# Patient Record
Sex: Female | Born: 1950 | Race: Black or African American | Hispanic: No | Marital: Married | Smoking: Former smoker
Health system: Southern US, Community
[De-identification: ages and names within clinical notes are randomized; demographics above are authoritative.]

## PROBLEM LIST (undated history)

## (undated) DIAGNOSIS — I10 Essential (primary) hypertension: Secondary | ICD-10-CM

## (undated) DIAGNOSIS — F329 Major depressive disorder, single episode, unspecified: Secondary | ICD-10-CM

## (undated) DIAGNOSIS — F32A Depression, unspecified: Secondary | ICD-10-CM

## (undated) DIAGNOSIS — E785 Hyperlipidemia, unspecified: Secondary | ICD-10-CM

## (undated) DIAGNOSIS — T7840XA Allergy, unspecified, initial encounter: Secondary | ICD-10-CM

## (undated) DIAGNOSIS — M1990 Unspecified osteoarthritis, unspecified site: Secondary | ICD-10-CM

## (undated) DIAGNOSIS — E119 Type 2 diabetes mellitus without complications: Secondary | ICD-10-CM

## (undated) DIAGNOSIS — G709 Myoneural disorder, unspecified: Secondary | ICD-10-CM

## (undated) DIAGNOSIS — F419 Anxiety disorder, unspecified: Secondary | ICD-10-CM

## (undated) DIAGNOSIS — K219 Gastro-esophageal reflux disease without esophagitis: Secondary | ICD-10-CM

## (undated) HISTORY — PX: ABDOMINAL HYSTERECTOMY: SHX81

## (undated) HISTORY — DX: Unspecified osteoarthritis, unspecified site: M19.90

## (undated) HISTORY — DX: Anxiety disorder, unspecified: F41.9

## (undated) HISTORY — DX: Hyperlipidemia, unspecified: E78.5

## (undated) HISTORY — DX: Gastro-esophageal reflux disease without esophagitis: K21.9

## (undated) HISTORY — DX: Essential (primary) hypertension: I10

## (undated) HISTORY — DX: Major depressive disorder, single episode, unspecified: F32.9

## (undated) HISTORY — DX: Type 2 diabetes mellitus without complications: E11.9

## (undated) HISTORY — DX: Depression, unspecified: F32.A

## (undated) HISTORY — DX: Myoneural disorder, unspecified: G70.9

## (undated) HISTORY — DX: Allergy, unspecified, initial encounter: T78.40XA

---

## 2014-01-07 DIAGNOSIS — H2589 Other age-related cataract: Secondary | ICD-10-CM | POA: Diagnosis not present

## 2014-01-07 DIAGNOSIS — H1045 Other chronic allergic conjunctivitis: Secondary | ICD-10-CM | POA: Diagnosis not present

## 2014-04-08 DIAGNOSIS — M5126 Other intervertebral disc displacement, lumbar region: Secondary | ICD-10-CM | POA: Diagnosis not present

## 2014-04-08 DIAGNOSIS — M5137 Other intervertebral disc degeneration, lumbosacral region: Secondary | ICD-10-CM | POA: Diagnosis not present

## 2014-06-27 DIAGNOSIS — M65311 Trigger thumb, right thumb: Secondary | ICD-10-CM | POA: Diagnosis not present

## 2014-09-13 DIAGNOSIS — I10 Essential (primary) hypertension: Secondary | ICD-10-CM | POA: Diagnosis not present

## 2015-04-02 ENCOUNTER — Encounter: Payer: Self-pay | Admitting: Physician Assistant

## 2015-04-02 ENCOUNTER — Ambulatory Visit (INDEPENDENT_AMBULATORY_CARE_PROVIDER_SITE_OTHER): Payer: Medicare Other | Admitting: Physician Assistant

## 2015-04-02 VITALS — BP 115/68 | HR 78 | Temp 97.8°F | Ht 67.0 in | Wt 180.0 lb

## 2015-04-02 DIAGNOSIS — E088 Diabetes mellitus due to underlying condition with unspecified complications: Secondary | ICD-10-CM

## 2015-04-02 DIAGNOSIS — E559 Vitamin D deficiency, unspecified: Secondary | ICD-10-CM | POA: Diagnosis not present

## 2015-04-02 DIAGNOSIS — E1140 Type 2 diabetes mellitus with diabetic neuropathy, unspecified: Secondary | ICD-10-CM | POA: Diagnosis not present

## 2015-04-02 DIAGNOSIS — E1142 Type 2 diabetes mellitus with diabetic polyneuropathy: Secondary | ICD-10-CM | POA: Insufficient documentation

## 2015-04-02 DIAGNOSIS — I10 Essential (primary) hypertension: Secondary | ICD-10-CM | POA: Diagnosis not present

## 2015-04-02 DIAGNOSIS — J309 Allergic rhinitis, unspecified: Secondary | ICD-10-CM | POA: Diagnosis not present

## 2015-04-02 NOTE — Progress Notes (Signed)
   Subjective:    Patient ID: Tammy Hudson, female    DOB: Oct 19, 1950, 64 y.o.   MRN: [id number]  HPI 64 y/o female with comorbid DM type 2 with peripheral neuropathy, htn, insomnia and anxiety presents for establishment of care. She has recently moved from [facility].     Review of Systems  Constitutional: Negative.   HENT: Negative.   Eyes:       Last eye exam 1 year ago.   Respiratory: Positive for shortness of breath.   Cardiovascular: Negative.   Gastrointestinal: Negative.   Endocrine: Positive for polyuria.  Genitourinary:       Nocturia   Musculoskeletal: Negative.   Skin: Negative.   Allergic/Immunologic: Negative.   Neurological: Positive for numbness (peripheral neuropathy ).  Hematological: Negative.   Psychiatric/Behavioral: Positive for sleep disturbance. The patient is nervous/anxious.        Objective:   Physical Exam  Constitutional: She appears well-developed and well-nourished. No distress.  HENT:  Head: Normocephalic and atraumatic.  Cardiovascular: Normal rate, regular rhythm, normal heart sounds and intact distal pulses.  Exam reveals no gallop and no friction rub.   No murmur heard. Skin: She is not diaphoretic.  Nursing note and vitals reviewed.         Assessment & Plan:  1. 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 Essential hypertension, benign  - TSH; Future - Lipid panel; Future - Microalbumin, urine; Future - Vit D  25 hydroxy (rtn osteoporosis monitoring); Future - CMP14+EGFR; Future - Hepatic function panel; Future - POCT glycosylated hemoglobin (Hb A1C); Future - CBC with Differential/Platelet; Future  3. Type 2 diabetes, controlled, with  neuropathy   4. Essential hypertension   5. Type 2 diabetes mellitus with peripheral neuropathy   6. Allergic rhinitis, unspecified allergic rhinitis type   7. Vitamin D deficiency  - Vit D  25 hydroxy (rtn osteoporosis monitoring); Future   Continue all meds Labs pending Health Maintenance reviewed Diet and exercise encouraged RTO 3 months   Tiffany A. Benjamin Stain PA-C

## 2015-04-03 ENCOUNTER — Other Ambulatory Visit (INDEPENDENT_AMBULATORY_CARE_PROVIDER_SITE_OTHER): Payer: Medicare Other

## 2015-04-03 DIAGNOSIS — I10 Essential (primary) hypertension: Secondary | ICD-10-CM | POA: Diagnosis not present

## 2015-04-03 DIAGNOSIS — E559 Vitamin D deficiency, unspecified: Secondary | ICD-10-CM | POA: Diagnosis not present

## 2015-04-03 DIAGNOSIS — E088 Diabetes mellitus due to underlying condition with unspecified complications: Secondary | ICD-10-CM | POA: Diagnosis not present

## 2015-04-03 LAB — POCT GLYCOSYLATED HEMOGLOBIN (HGB A1C): Hemoglobin A1C: 6

## 2015-04-03 NOTE — Progress Notes (Signed)
Lab only 

## 2015-04-04 LAB — CMP14+EGFR
A/G RATIO: 1.5 (ref 1.1–2.5)
ALT: 13 IU/L (ref 0–32)
AST: 32 IU/L (ref 0–40)
Albumin: 4 g/dL (ref 3.6–4.8)
Alkaline Phosphatase: 86 IU/L (ref 39–117)
BUN/Creatinine Ratio: 14 (ref 11–26)
BUN: 12 mg/dL (ref 8–27)
Bilirubin Total: 0.3 mg/dL (ref 0.0–1.2)
CALCIUM: 9.4 mg/dL (ref 8.7–10.3)
CO2: 25 mmol/L (ref 18–29)
CREATININE: 0.87 mg/dL (ref 0.57–1.00)
Chloride: 106 mmol/L (ref 97–108)
GFR, EST AFRICAN AMERICAN: 81 mL/min/{1.73_m2} (ref >59)
GFR, EST NON AFRICAN AMERICAN: 71 mL/min/{1.73_m2} (ref >59)
GLOBULIN, TOTAL: 2.7 g/dL (ref 1.5–4.5)
Glucose: 101 mg/dL — ABNORMAL HIGH (ref 65–99)
POTASSIUM: 4.6 mmol/L (ref 3.5–5.2)
SODIUM: 146 mmol/L — AB (ref 134–144)
TOTAL PROTEIN: 6.7 g/dL (ref 6.0–8.5)

## 2015-04-04 LAB — CBC WITH DIFFERENTIAL/PLATELET
BASOS: 0 %
Basophils Absolute: 0 10*3/uL (ref 0.0–0.2)
EOS (ABSOLUTE): 0.3 10*3/uL (ref 0.0–0.4)
EOS: 5 %
HEMATOCRIT: 43.7 % (ref 34.0–46.6)
Hemoglobin: 14.5 g/dL (ref 11.1–15.9)
Immature Grans (Abs): 0 10*3/uL (ref 0.0–0.1)
Immature Granulocytes: 0 %
LYMPHS ABS: 2.4 10*3/uL (ref 0.7–3.1)
Lymphs: 43 %
MCH: 30.7 pg (ref 26.6–33.0)
MCHC: 33.2 g/dL (ref 31.5–35.7)
MCV: 92 fL (ref 79–97)
MONOS ABS: 0.7 10*3/uL (ref 0.1–0.9)
Monocytes: 12 %
Neutrophils Absolute: 2.2 10*3/uL (ref 1.4–7.0)
Neutrophils: 40 %
PLATELETS: 255 10*3/uL (ref 150–379)
RBC: 4.73 x10E6/uL (ref 3.77–5.28)
RDW: 14.3 % (ref 12.3–15.4)
WBC: 5.5 10*3/uL (ref 3.4–10.8)

## 2015-04-04 LAB — LIPID PANEL
CHOLESTEROL TOTAL: 261 mg/dL — AB (ref 100–199)
Chol/HDL Ratio: 6.2 ratio units — ABNORMAL HIGH (ref 0.0–4.4)
HDL: 42 mg/dL (ref >39)
LDL Calculated: 181 mg/dL — ABNORMAL HIGH (ref 0–99)
TRIGLYCERIDES: 189 mg/dL — AB (ref 0–149)
VLDL CHOLESTEROL CAL: 38 mg/dL (ref 5–40)

## 2015-04-04 LAB — VITAMIN D 25 HYDROXY (VIT D DEFICIENCY, FRACTURES): Vit D, 25-Hydroxy: 11.9 ng/mL — ABNORMAL LOW (ref 30.0–100.0)

## 2015-04-04 LAB — MICROALBUMIN, URINE: Microalbumin, Urine: 5.5 ug/mL

## 2015-04-04 LAB — TSH: TSH: 1.76 u[IU]/mL (ref 0.450–4.500)

## 2015-04-04 LAB — HEPATIC FUNCTION PANEL: Bilirubin, Direct: 0.08 mg/dL (ref 0.00–0.40)

## 2015-04-08 ENCOUNTER — Other Ambulatory Visit: Payer: Self-pay | Admitting: Physician Assistant

## 2015-04-08 DIAGNOSIS — G629 Polyneuropathy, unspecified: Secondary | ICD-10-CM

## 2015-04-08 DIAGNOSIS — F329 Major depressive disorder, single episode, unspecified: Secondary | ICD-10-CM

## 2015-04-08 DIAGNOSIS — F32A Depression, unspecified: Secondary | ICD-10-CM

## 2015-04-08 DIAGNOSIS — I10 Essential (primary) hypertension: Secondary | ICD-10-CM

## 2015-04-08 DIAGNOSIS — E119 Type 2 diabetes mellitus without complications: Secondary | ICD-10-CM

## 2015-04-08 MED ORDER — METFORMIN HCL 500 MG PO TABS: 500.0000 mg | ORAL_TABLET | Freq: Every day | ORAL | Status: DC

## 2015-04-08 MED ORDER — MIRTAZAPINE 15 MG PO TABS: 15.0000 mg | ORAL_TABLET | Freq: Every day | ORAL | Status: DC

## 2015-04-08 MED ORDER — GABAPENTIN 100 MG PO CAPS: 100.0000 mg | ORAL_CAPSULE | Freq: Three times a day (TID) | ORAL | Status: DC

## 2015-04-08 MED ORDER — AMLODIPINE BESYLATE 5 MG PO TABS: 5.0000 mg | ORAL_TABLET | Freq: Every day | ORAL | Status: DC

## 2015-04-08 MED ORDER — LORATADINE 10 MG PO TABS: 10.0000 mg | ORAL_TABLET | Freq: Every day | ORAL | Status: DC

## 2015-04-08 MED ORDER — PROPRANOLOL HCL 10 MG PO TABS: 10.0000 mg | ORAL_TABLET | Freq: Three times a day (TID) | ORAL | Status: DC | PRN

## 2015-04-15 LAB — HM MAMMOGRAPHY

## 2015-05-13 ENCOUNTER — Other Ambulatory Visit: Payer: Self-pay

## 2015-05-13 MED ORDER — LORATADINE 10 MG PO TABS: 10.0000 mg | ORAL_TABLET | Freq: Every day | ORAL | Status: DC

## 2015-07-07 DIAGNOSIS — M545 Low back pain: Secondary | ICD-10-CM | POA: Diagnosis not present

## 2015-09-08 DIAGNOSIS — M4806 Spinal stenosis, lumbar region: Secondary | ICD-10-CM | POA: Diagnosis not present

## 2015-09-16 DIAGNOSIS — M545 Low back pain: Secondary | ICD-10-CM | POA: Diagnosis not present

## 2015-09-16 DIAGNOSIS — M5137 Other intervertebral disc degeneration, lumbosacral region: Secondary | ICD-10-CM | POA: Diagnosis not present

## 2015-10-16 ENCOUNTER — Ambulatory Visit (INDEPENDENT_AMBULATORY_CARE_PROVIDER_SITE_OTHER): Payer: Medicare Other

## 2015-10-16 ENCOUNTER — Encounter: Payer: Self-pay | Admitting: Family Medicine

## 2015-10-16 ENCOUNTER — Telehealth: Payer: Self-pay | Admitting: Family Medicine

## 2015-10-16 ENCOUNTER — Ambulatory Visit (INDEPENDENT_AMBULATORY_CARE_PROVIDER_SITE_OTHER): Payer: Medicare Other | Admitting: Family Medicine

## 2015-10-16 VITALS — BP 133/79 | HR 76 | Temp 98.3°F | Ht 67.0 in | Wt 175.6 lb

## 2015-10-16 DIAGNOSIS — E1140 Type 2 diabetes mellitus with diabetic neuropathy, unspecified: Secondary | ICD-10-CM | POA: Diagnosis not present

## 2015-10-16 DIAGNOSIS — M5431 Sciatica, right side: Secondary | ICD-10-CM | POA: Diagnosis not present

## 2015-10-16 DIAGNOSIS — R1013 Epigastric pain: Secondary | ICD-10-CM

## 2015-10-16 MED ORDER — TRAMADOL HCL 50 MG PO TABS: 50.0000 mg | ORAL_TABLET | Freq: Four times a day (QID) | ORAL | Status: DC | PRN

## 2015-10-16 NOTE — Progress Notes (Signed)
   HPI  Patient presents today with abdominal pain.  Patient explains she has crampy epigastric abdominal pain which started about 3-4 days ago. She has associated nausea, no vomiting, and constipation. She's had 2-3 small bowel movements since this started. Symptoms all began about 2-3 days after eating pinto beans, she states that normally she is very regular.  She denies any vomiting He denies alcohol use She has type 2 diabetes, followed here and also at a clinic in [place], her last A1c was 6.2, she has good medication compliance  PMH: Smoking status noted ROS: Per HPI  Objective: BP 133/79 mmHg  Pulse 76  Temp(Src) 98.3 F (36.8 C) (Oral)  Ht 5' 7" (1.702 m)  Wt 175 lb 9.6 oz (79.652 kg)  BMI 27.50 kg/m2 Gen: NAD, alert, cooperative with exam HEENT: NCAT CV: RRR, good S1/S2, no murmur Resp: CTABL, no wheezes, non-labored Abd: SNTND, BS present, no guarding or organomegaly Ext: No edema, warm Neuro: Alert and oriented, No gross deficits  DG abd- prominent bowel gas, some concern for air fluid levels on the R, awaiting read by radiology  Assessment and plan:  # Abdominal pain Likely constipation related Confirmed with x-ray and rule out other etiology using labs Consider also obstruction, although very unlikely with normal bowel movements and no vomiting, worsening diabetes, pancreatitis Return to clinic with any concerns or failure to improve as expected Senna S 2 pills 3.-4 days No clinical obstruction   # Sciatica Status post recent epidural injection Filled tramadol      Orders Placed This Encounter  Procedures  . DG Abd 2 Views    Standing Status: Future     Number of Occurrences:      Standing Expiration Date: 12/15/2016    Order Specific Question:  Reason for Exam (SYMPTOM  OR DIAGNOSIS REQUIRED)    Answer:  abd pain, eval stool burden    Order Specific Question:  Preferred imaging location?    Answer:  Internal  . Lipase  .  CMP14+EGFR  . CBC with Differential    Meds ordered this encounter  Medications  . DISCONTD: traMADol (ULTRAM) 50 MG tablet    Sig:   . traMADol (ULTRAM) 50 MG tablet    Sig: Take 1 tablet (50 mg total) by mouth every 6 (six) hours as needed.    Dispense:  60 tablet    Refill:  0    Sam Bradshaw, MD [facility] 10/16/2015, 11:04 AM      

## 2015-10-16 NOTE — Telephone Encounter (Signed)
Pt returned call.   She understands recommendations. She will have sips of liquids today, full liquids tomorrow and try to takle amll meals on Sunday. If she is vomiting, has sevre pain, or cannot tolerate fluids she will seek help in the ED.   Discussed ileus and sual course  Laroy Apple, MD [facility] 10/16/2015, 1:03 PM

## 2015-10-16 NOTE — Telephone Encounter (Signed)
Attempted call, left message with her mother to call back  Possible developing ileus, will ask her not to take senna, plan bowel rest with sips of fluids for tonight. Can advance to liquids tomorrow. Ok to advance to foods Sunday if tolerating fluids well. Low threshold for ED if abd pain is severe or begins to vomit.   Would ask her to return on Monday for exam and repeat exam.

## 2015-10-16 NOTE — Patient Instructions (Signed)
Great to meet you!  It sounds like you are having constipation, i am doing extra studies to rule out additional causes of your pain.   Lets try sennakot S, 2 pills once daily for the next 3-4 days  We will call with labs within 1 week

## 2015-10-19 ENCOUNTER — Encounter (INDEPENDENT_AMBULATORY_CARE_PROVIDER_SITE_OTHER): Payer: Self-pay

## 2015-10-19 ENCOUNTER — Ambulatory Visit (INDEPENDENT_AMBULATORY_CARE_PROVIDER_SITE_OTHER): Payer: Medicare Other

## 2015-10-19 ENCOUNTER — Ambulatory Visit (INDEPENDENT_AMBULATORY_CARE_PROVIDER_SITE_OTHER): Payer: Medicare Other | Admitting: Family Medicine

## 2015-10-19 ENCOUNTER — Encounter: Payer: Self-pay | Admitting: Family Medicine

## 2015-10-19 VITALS — BP 144/88 | HR 73 | Temp 97.6°F | Ht 67.0 in | Wt 173.8 lb

## 2015-10-19 DIAGNOSIS — R109 Unspecified abdominal pain: Secondary | ICD-10-CM

## 2015-10-19 NOTE — Progress Notes (Signed)
   HPI  Patient presents today here for recheck of abdominal pain.  Patient explains that her nausea, abdominal pain, and symptoms have resolved. She's been tolerating fluids throughout the weekend without any problem. However she has not been eating, only drinking fluids. She has mild belching She is passing gas light normal. She denies any abdominal pain. No fever, chills, sweats.   PMH: Smoking status noted ROS: Per HPI  Objective: BP 144/88 mmHg  Pulse 73  Temp(Src) 97.6 F (36.4 C) (Oral)  Ht 5\' 7"  (1.702 m)  Wt 173 lb 12.8 oz (78.835 kg)  BMI 27.21 kg/m2 Gen: NAD, alert, cooperative with exam HEENT: NCAT CV: RRR, good S1/S2, no murmur Resp: CTABL, no wheezes, non-labored Abd: soft, mild tenderness to palpation in RLQ, no guarding, + BS Ext: No edema, warm Neuro: Alert and oriented, No gross deficits  Plain film of the abdomen: Normal bowel gas pattern  Assessment and plan:  # Nausea, abdominal pain Improving, advance diet Plain film of the abdomen shows that air-fluid levels are resolved Awaiting radiology read  Discussed advancing diet slowly Return to clinic with any concerns, consider blood work and/or more advanced imaging considering her right lower quadrant abdominal pain, however her exam is very reassuring and her pain is mild on palpation     Orders Placed This Encounter  Procedures  . DG Abd 2 Views    Standing Status: Future     Number of Occurrences: 1     Standing Expiration Date: 12/18/2016    Order Specific Question:  Reason for Exam (SYMPTOM  OR DIAGNOSIS REQUIRED)    Answer:  re-check, abd pain and nasea, concern for ileus last film    Order Specific Question:  Preferred imaging location?    Answer:  Internal    Laroy Apple, MD [facility] 10/19/2015, 2:50 PM

## 2015-10-19 NOTE — Patient Instructions (Signed)
Great to see you!  Try to start eating small meals, you may want to try full liquids a time or two before progressing all the way to normal. (think ensure or milkshakes)  Come back with any concerns

## 2015-10-27 DIAGNOSIS — M4806 Spinal stenosis, lumbar region: Secondary | ICD-10-CM | POA: Diagnosis not present

## 2015-11-09 DIAGNOSIS — M47816 Spondylosis without myelopathy or radiculopathy, lumbar region: Secondary | ICD-10-CM | POA: Diagnosis not present

## 2015-11-09 DIAGNOSIS — M545 Low back pain: Secondary | ICD-10-CM | POA: Diagnosis not present

## 2015-11-09 DIAGNOSIS — M4806 Spinal stenosis, lumbar region: Secondary | ICD-10-CM | POA: Diagnosis not present

## 2015-11-10 ENCOUNTER — Encounter: Payer: Self-pay | Admitting: *Deleted

## 2015-11-13 DIAGNOSIS — R05 Cough: Secondary | ICD-10-CM | POA: Diagnosis not present

## 2015-11-26 ENCOUNTER — Encounter (INDEPENDENT_AMBULATORY_CARE_PROVIDER_SITE_OTHER): Payer: Self-pay

## 2015-12-08 ENCOUNTER — Encounter: Payer: Self-pay | Admitting: Family Medicine

## 2015-12-08 ENCOUNTER — Ambulatory Visit (INDEPENDENT_AMBULATORY_CARE_PROVIDER_SITE_OTHER): Payer: Medicare Other | Admitting: Family Medicine

## 2015-12-08 VITALS — BP 132/81 | HR 65 | Temp 97.6°F | Ht 67.0 in | Wt 171.2 lb

## 2015-12-08 DIAGNOSIS — E1142 Type 2 diabetes mellitus with diabetic polyneuropathy: Secondary | ICD-10-CM

## 2015-12-08 DIAGNOSIS — J309 Allergic rhinitis, unspecified: Secondary | ICD-10-CM | POA: Diagnosis not present

## 2015-12-08 MED ORDER — FEXOFENADINE HCL 180 MG PO TABS: 180.0000 mg | ORAL_TABLET | Freq: Every day | ORAL | Status: DC

## 2015-12-08 NOTE — Patient Instructions (Addendum)
Great to see you!  Change loratidine to allegra , continue flonase  Its ok to stop metformin but you will have to watch your diet closer.  Diet Recommendations for Diabetes   Starchy (carb) foods include: Bread, rice, pasta, potatoes, corn, crackers, bagels, muffins, all baked goods.   Protein foods include: Meat, fish, poultry, eggs, dairy foods, and beans such as pinto and kidney beans (beans also provide carbohydrate).   1. Eat at least 3 meals and 1-2 snacks per day. Never go more than 4-5 hours while awake without eating.  2. Limit starchy foods to TWO per meal and ONE per snack. ONE portion of a starchy  food is equal to the following:   - ONE slice of bread (or its equivalent, such as half of a hamburger bun).   - 1/2 cup of a "scoopable" starchy food such as potatoes or rice.   - 1 OUNCE (28 grams) of starchy snack foods such as crackers or pretzels (look on label).   - 15 grams of carbohydrate as shown on food label.  3. Both lunch and dinner should include a protein food, a carb food, and vegetables.   - Obtain twice as many veg's as protein or carbohydrate foods for both lunch and dinner.   - Try to keep frozen veg's on hand for a quick vegetable serving.     - Fresh or frozen veg's are best.  4. Breakfast should always include protein.

## 2015-12-08 NOTE — Progress Notes (Signed)
   HPI  Patient presents today here with worsening allergies.  Patient's lines over the last week she's had worsening runny nose and bilateral watering eyes with irritation. She has no fever, chills, sweats, cough, or shortness of breath. She has had frequent throat clearing.  She would really like to stop metformin, her brothers currently in the hospital with lactic acidosis after complications involving metformin, presumably after he has had renal issues. We discussed this at length and I explained that metformin does not cause renal failure however it can cause lactic acidosis after renal failure or decreased renal function. Watching her diet closely as pertains to diabetes.  Like to change primary care to [place].   PMH: Smoking status noted ROS: Per HPI  Objective: BP 132/81 mmHg  Pulse 65  Temp(Src) 97.6 F (36.4 C) (Oral)  Ht 5\' 7"  (1.702 m)  Wt 171 lb 3.2 oz (77.656 kg)  BMI 26.81 kg/m2 Gen: NAD, alert, cooperative with exam HEENT: NCAT, nares with swelling of the turbinates bilaterally, oropharynx clear CV: RRR, good S1/S2, no murmur Resp: CTABL, no wheezes, non-labored Abd: SNTND, BS present, no guarding or organomegaly Ext: No edema, warm Neuro: Alert and oriented, No gross deficits  Assessment and plan:  # Seasonal allergies, allergic rhinitis Continue Flonase, change Claritin to [employer] today, she would like to take Allegra first and see if that makes a difference. Would gladly send Singulair if she does not have good improvement after a couple of weeks.   # Type 2 diabetes Last A1c well controlled She has good diet compliance Would like to stop metformin For now stop metformin, no additional medications, if diabetes control slips to above A1c of 7 will plan to start Tradjenta or other DPP4    Meds ordered this encounter  Medications  . fexofenadine (ALLEGRA) 180 MG tablet    Sig: Take 1 tablet (180 mg total) by mouth daily.   Dispense:  90 tablet    Refill:  [place], MD [facility] 12/08/2015, 11:36 AM

## 2016-01-04 ENCOUNTER — Other Ambulatory Visit: Payer: Self-pay | Admitting: Family Medicine

## 2016-01-05 MED ORDER — SIMVASTATIN 10 MG PO TABS: 10.0000 mg | ORAL_TABLET | Freq: Every day | ORAL | Status: DC

## 2016-01-05 NOTE — Telephone Encounter (Signed)
I have sent some simvastatin, I do not think she was on it (unless it was from another office). But according to her labs it is safe and indicated.   I have kept dose low with amlodipine on board, limit 20 mg  Recommend repeat labs in 2-3 months  Laroy Apple, MD [facility] 01/05/2016, 9:06 AM

## 2016-01-05 NOTE — Telephone Encounter (Signed)
Patient informed. 

## 2016-01-05 NOTE — Telephone Encounter (Signed)
Patient requesting refill of simvastatin to [facility], last lipid panel September 2016 and I do not see this medication on her med list or listed in [facility] notes

## 2016-01-29 ENCOUNTER — Telehealth: Payer: Self-pay | Admitting: Family Medicine

## 2016-01-29 DIAGNOSIS — E1169 Type 2 diabetes mellitus with other specified complication: Secondary | ICD-10-CM | POA: Insufficient documentation

## 2016-01-29 DIAGNOSIS — E1142 Type 2 diabetes mellitus with diabetic polyneuropathy: Secondary | ICD-10-CM

## 2016-01-29 DIAGNOSIS — E559 Vitamin D deficiency, unspecified: Secondary | ICD-10-CM | POA: Insufficient documentation

## 2016-01-29 DIAGNOSIS — E785 Hyperlipidemia, unspecified: Secondary | ICD-10-CM

## 2016-01-29 NOTE — Telephone Encounter (Signed)
Patient aware that orders for labs have been placed in chart

## 2016-01-29 NOTE — Telephone Encounter (Signed)
Patient has orders in for labs

## 2016-01-29 NOTE — Telephone Encounter (Signed)
Orders placed, Pt will need to be seen to dicuss results in 1-2 weeks.   Laroy Apple, MD [facility] 01/29/2016, 8:53 AM

## 2016-02-08 ENCOUNTER — Other Ambulatory Visit: Payer: Self-pay

## 2016-02-08 ENCOUNTER — Other Ambulatory Visit: Payer: Medicare Other

## 2016-02-08 DIAGNOSIS — E1142 Type 2 diabetes mellitus with diabetic polyneuropathy: Secondary | ICD-10-CM

## 2016-02-08 DIAGNOSIS — E785 Hyperlipidemia, unspecified: Secondary | ICD-10-CM | POA: Diagnosis not present

## 2016-02-08 DIAGNOSIS — E1140 Type 2 diabetes mellitus with diabetic neuropathy, unspecified: Secondary | ICD-10-CM

## 2016-02-08 LAB — BAYER DCA HB A1C WAIVED: HB A1C (BAYER DCA - WAIVED): 6.1 % (ref <7.0)

## 2016-02-09 LAB — CMP14+EGFR
A/G RATIO: 1.6 (ref 1.2–2.2)
ALBUMIN: 4.2 g/dL (ref 3.6–4.8)
ALT: 17 IU/L (ref 0–32)
AST: 37 IU/L (ref 0–40)
Alkaline Phosphatase: 73 IU/L (ref 39–117)
BUN / CREAT RATIO: 17 (ref 12–28)
BUN: 15 mg/dL (ref 8–27)
Bilirubin Total: 0.5 mg/dL (ref 0.0–1.2)
CALCIUM: 9.4 mg/dL (ref 8.7–10.3)
CO2: 26 mmol/L (ref 18–29)
Chloride: 101 mmol/L (ref 96–106)
Creatinine, Ser: 0.87 mg/dL (ref 0.57–1.00)
GFR, EST AFRICAN AMERICAN: 81 mL/min/{1.73_m2} (ref >59)
GFR, EST NON AFRICAN AMERICAN: 71 mL/min/{1.73_m2} (ref >59)
GLOBULIN, TOTAL: 2.7 g/dL (ref 1.5–4.5)
Glucose: 99 mg/dL (ref 65–99)
POTASSIUM: 4.6 mmol/L (ref 3.5–5.2)
SODIUM: 141 mmol/L (ref 134–144)
TOTAL PROTEIN: 6.9 g/dL (ref 6.0–8.5)

## 2016-02-09 LAB — LIPID PANEL
CHOLESTEROL TOTAL: 179 mg/dL (ref 100–199)
Chol/HDL Ratio: 3.9 ratio units (ref 0.0–4.4)
HDL: 46 mg/dL (ref >39)
LDL Calculated: 92 mg/dL (ref 0–99)
TRIGLYCERIDES: 204 mg/dL — AB (ref 0–149)
VLDL Cholesterol Cal: 41 mg/dL — ABNORMAL HIGH (ref 5–40)

## 2016-02-09 LAB — CBC WITH DIFFERENTIAL/PLATELET
BASOS: 0 %
Basophils Absolute: 0 10*3/uL (ref 0.0–0.2)
EOS (ABSOLUTE): 0.2 10*3/uL (ref 0.0–0.4)
Eos: 3 %
Hematocrit: 43.4 % (ref 34.0–46.6)
Hemoglobin: 14.2 g/dL (ref 11.1–15.9)
IMMATURE GRANS (ABS): 0 10*3/uL (ref 0.0–0.1)
Immature Granulocytes: 0 %
LYMPHS ABS: 2.5 10*3/uL (ref 0.7–3.1)
LYMPHS: 43 %
MCH: 30.5 pg (ref 26.6–33.0)
MCHC: 32.7 g/dL (ref 31.5–35.7)
MCV: 93 fL (ref 79–97)
Monocytes Absolute: 0.4 10*3/uL (ref 0.1–0.9)
Monocytes: 7 %
NEUTROS ABS: 2.7 10*3/uL (ref 1.4–7.0)
Neutrophils: 47 %
PLATELETS: 241 10*3/uL (ref 150–379)
RBC: 4.66 x10E6/uL (ref 3.77–5.28)
RDW: 14.4 % (ref 12.3–15.4)
WBC: 5.9 10*3/uL (ref 3.4–10.8)

## 2016-02-15 ENCOUNTER — Ambulatory Visit: Payer: Medicare Other | Admitting: Family Medicine

## 2016-02-15 ENCOUNTER — Ambulatory Visit (INDEPENDENT_AMBULATORY_CARE_PROVIDER_SITE_OTHER): Payer: Medicare Other | Admitting: Family Medicine

## 2016-02-15 ENCOUNTER — Encounter: Payer: Self-pay | Admitting: Family Medicine

## 2016-02-15 VITALS — BP 132/81 | HR 74 | Temp 98.3°F | Ht 67.0 in | Wt 170.0 lb

## 2016-02-15 DIAGNOSIS — M5442 Lumbago with sciatica, left side: Secondary | ICD-10-CM | POA: Insufficient documentation

## 2016-02-15 DIAGNOSIS — I10 Essential (primary) hypertension: Secondary | ICD-10-CM

## 2016-02-15 DIAGNOSIS — E1142 Type 2 diabetes mellitus with diabetic polyneuropathy: Secondary | ICD-10-CM | POA: Diagnosis not present

## 2016-02-15 MED ORDER — FEXOFENADINE HCL 180 MG PO TABS
180.0000 mg | ORAL_TABLET | Freq: Every day | ORAL | 3 refills | Status: DC
Start: 2016-02-15 — End: 2016-11-16

## 2016-02-15 MED ORDER — GABAPENTIN 300 MG PO CAPS: 300.0000 mg | ORAL_CAPSULE | Freq: Three times a day (TID) | ORAL | 5 refills | Status: DC

## 2016-02-15 MED ORDER — PROPRANOLOL HCL 10 MG PO TABS: 10.0000 mg | ORAL_TABLET | Freq: Three times a day (TID) | ORAL | 5 refills | Status: DC | PRN

## 2016-02-15 MED ORDER — TRAMADOL HCL 50 MG PO TABS: 50.0000 mg | ORAL_TABLET | Freq: Four times a day (QID) | ORAL | 2 refills | Status: DC | PRN

## 2016-02-15 NOTE — Patient Instructions (Signed)
Great to see you!  I have increased your gabapentin (this can also help with anxiety) Increase to 300 mg capsule at night for 1 week, then 1 capsule at night and in the morning for 1 week, then 1 capsule 3 times a day.   I have also refilled her tramadol.  Please come back in 4 months.

## 2016-02-15 NOTE — Progress Notes (Signed)
   HPI  Patient presents today here for diabetes follow-up and lab review.  Diabetes Good medication compliance We did not review her CBGs. Watches her diet.  Left-sided low back pain with sciatica Has intermittent problems, she's having increased problems recently She has had improvement with gabapentin, however she's never titrated the dose Tramadol also helping, request increase in dose.  Hypertension No chest pain, dyspnea, palpitations, leg edema. Good medication compliance   PMH: Smoking status noted ROS: Per HPI  Objective: BP 132/81   Pulse 74   Temp 98.3 F (36.8 C) (Oral)   Ht 5\' 7"  (1.702 m)   Wt 170 lb (77.1 kg)   BMI 26.63 kg/m  Gen: NAD, alert, cooperative with exam HEENT: NCAT CV: RRR, good S1/S2, no murmur Resp: CTABL, no wheezes, non-labored Ext: No edema, warm Neuro: Alert and oriented, No gross deficits  Assessment and plan:  # Left-sided low back pain with sciatica Stable pain, requesting increase in tramadol. Titrate gabapentin, discussed keeping the tramadol does stable.   # Essential hypertension Well-controlled on amlodipine  # Type 2 diabetes Well-controlled on only metformin. Her kidney function is very good.    Meds ordered this encounter  Medications  . gabapentin (NEURONTIN) 300 MG capsule    Sig: Take 1 capsule (300 mg total) by mouth 3 (three) times daily.    Dispense:  90 capsule    Refill:  5  . propranolol (INDERAL) 10 MG tablet    Sig: Take 1 tablet (10 mg total) by mouth 3 (three) times daily as needed.    Dispense:  90 tablet    Refill:  5  . traMADol (ULTRAM) 50 MG tablet    Sig: Take 1 tablet (50 mg total) by mouth every 6 (six) hours as needed.    Dispense:  60 tablet    Refill:  [place], MD [facility] 02/15/2016, 5:25 PM

## 2016-05-06 ENCOUNTER — Encounter: Payer: Self-pay | Admitting: Family Medicine

## 2016-05-06 ENCOUNTER — Ambulatory Visit (INDEPENDENT_AMBULATORY_CARE_PROVIDER_SITE_OTHER): Payer: Medicare Other | Admitting: Family Medicine

## 2016-05-06 VITALS — BP 138/82 | HR 73 | Temp 98.1°F | Ht 67.0 in | Wt 167.0 lb

## 2016-05-06 DIAGNOSIS — I10 Essential (primary) hypertension: Secondary | ICD-10-CM

## 2016-05-06 DIAGNOSIS — E1142 Type 2 diabetes mellitus with diabetic polyneuropathy: Secondary | ICD-10-CM

## 2016-05-06 DIAGNOSIS — Z634 Disappearance and death of family member: Secondary | ICD-10-CM

## 2016-05-06 MED ORDER — ALPRAZOLAM 0.25 MG PO TABS: 0.2500 mg | ORAL_TABLET | Freq: Two times a day (BID) | ORAL | 0 refills | Status: DC | PRN

## 2016-05-06 NOTE — Patient Instructions (Addendum)
   I am so sorry to hear about your sister.   Lets follow up in 3 months unless you need [place] sooner

## 2016-05-06 NOTE — Progress Notes (Signed)
   HPI  Patient presents today here for follow-up chronic medical conditions and bereavement.  Patient is found to her sister hasterminal cancer 2 days ago. She requests something to help her nerves. She is very sad and very upsetthat she will likely lose her sister.  Type 2 diabetes She watches her diet, does not check her blood sugars as it has been well controlled for several years. Tolerates metformin easily. No foot numbness.  Hypertension Good medication compliance,  no chest pain, dyspnea, palpitations, leg edema.   She is not taking tramadol any longer  PMH: Smoking status noted ROS: Per HPI  Objective: BP 138/82   Pulse 73   Temp 98.1 F (36.7 C) (Oral)   Ht 5\' 7"  (1.702 m)   Wt 167 lb (75.8 kg)   BMI 26.16 kg/m  Gen: NAD, alert, cooperative with exam, tearful HEENT: NCAT CV: RRR, good S1/S2, no murmur Resp: CTABL, no wheezes, non-labored Ext: No edema, warm Neuro: Alert and oriented, No gross deficits  Diabetic Foot Exam - Simple   Simple Foot Form Visual Inspection No deformities, no ulcerations, no other skin breakdown bilaterally:  Yes Sensation Testing Intact to touch and monofilament testing bilaterally:  Yes Pulse Check Posterior Tibialis and Dorsalis pulse intact bilaterally:  Yes Comments      Assessment and plan:  # bereavement Understandable sadness, I've given her low dose xanax to help with anxiety RTC as needed   # T2DM Well controlled previously given current loss I have deferred labs A1C in 3 months COntinue metformin  # HTN Well controlled on amlodipine Consider lisinopril   Pt will request [facility] records  Opthalmology referral  Meds ordered this encounter  Medications  . ALPRAZolam (XANAX) 0.25 MG tablet    Sig: Take 1 tablet (0.25 mg total) by mouth 2 (two) times daily as needed for anxiety.    Dispense:  30 tablet    Refill:  0    Laroy Apple, MD [facility] 05/06/2016, 3:44  PM

## 2016-05-27 ENCOUNTER — Other Ambulatory Visit: Payer: Self-pay | Admitting: Family Medicine

## 2016-05-27 MED ORDER — ALPRAZOLAM 0.25 MG PO TABS: 0.2500 mg | ORAL_TABLET | Freq: Two times a day (BID) | ORAL | 0 refills | Status: DC | PRN

## 2016-05-27 NOTE — Telephone Encounter (Signed)
#  30 ordered on 05/06/16, she takes bid. Needs new Rx, route to pool for call in

## 2016-05-30 ENCOUNTER — Other Ambulatory Visit: Payer: Self-pay | Admitting: Family Medicine

## 2016-05-31 ENCOUNTER — Other Ambulatory Visit: Payer: Self-pay | Admitting: Family Medicine

## 2016-05-31 NOTE — Telephone Encounter (Signed)
Dr. Wendi Snipes, I contacted the pharmacy and the medication had never been phoned in, so I called [facility] and called in the refill.

## 2016-05-31 NOTE — Telephone Encounter (Signed)
I sent 60 pills 4 days ago, this request is too son for refill.   Laroy Apple, MD [facility] 05/31/2016, 9:36 AM

## 2016-06-01 DIAGNOSIS — E119 Type 2 diabetes mellitus without complications: Secondary | ICD-10-CM | POA: Diagnosis not present

## 2016-06-01 DIAGNOSIS — Z7984 Long term (current) use of oral hypoglycemic drugs: Secondary | ICD-10-CM | POA: Diagnosis not present

## 2016-06-01 DIAGNOSIS — H04123 Dry eye syndrome of bilateral lacrimal glands: Secondary | ICD-10-CM | POA: Diagnosis not present

## 2016-06-01 DIAGNOSIS — H43812 Vitreous degeneration, left eye: Secondary | ICD-10-CM | POA: Diagnosis not present

## 2016-06-02 ENCOUNTER — Ambulatory Visit (INDEPENDENT_AMBULATORY_CARE_PROVIDER_SITE_OTHER): Payer: Medicare Other | Admitting: Pediatrics

## 2016-06-02 ENCOUNTER — Encounter: Payer: Self-pay | Admitting: Pediatrics

## 2016-06-02 VITALS — BP 145/80 | HR 80 | Temp 97.4°F | Ht 67.0 in | Wt 169.4 lb

## 2016-06-02 DIAGNOSIS — I10 Essential (primary) hypertension: Secondary | ICD-10-CM | POA: Diagnosis not present

## 2016-06-02 DIAGNOSIS — T148XXA Other injury of unspecified body region, initial encounter: Secondary | ICD-10-CM

## 2016-06-02 DIAGNOSIS — E1142 Type 2 diabetes mellitus with diabetic polyneuropathy: Secondary | ICD-10-CM | POA: Diagnosis not present

## 2016-06-02 NOTE — Progress Notes (Signed)
  Subjective:   Patient ID: [name], female    DOB: [birth year], 65 y.o.   MRN: [id number] CC: Bruise  HPI: Tammy Hudson is a 65 y.o. female presenting for Bruise  Two days ago noticed dark areas like bruises x2 apprx 1 cm on her L lower leg Was red around two places yesterday, no redness today Does not remember hurting herself Feels "different", slightly tender yesterday, not tender today No swelling in legs No other bruises Has been feeling well otherwise No fevers No pain walking on leg No other rashes  Relevant past medical, surgical, family and social history reviewed. Allergies and medications reviewed and updated. History  Smoking Status  . Former Smoker  . Quit date: 04/02/1995  Smokeless Tobacco  . Never Used   ROS: Per HPI   Objective:    BP (!) 145/80   Pulse 80   Temp 97.4 F (36.3 C) (Oral)   Ht 5\' 7"  (1.702 m)   Wt 169 lb 6.4 oz (76.8 kg)   BMI 26.53 kg/m   Wt Readings from Last 3 Encounters:  06/02/16 169 lb 6.4 oz (76.8 kg)  05/06/16 167 lb (75.8 kg)  02/15/16 170 lb (77.1 kg)    Gen: NAD, alert, cooperative with exam, NCAT EYES: EOMI, no conjunctival injection, or no icterus CV: NRRR, normal S1/S2, no murmur Resp: CTABL, no wheezes, normal WOB Ext: No edema, warm Neuro: Alert and oriented Skin: L lower leg medial lower leg and lateral lower leg with 12-45mm dark purple patches on shin, some induration present around patches, slight yellowing around edges of purple patches.    Assessment & Plan:  Martesha was seen today for likely bruises. Pt does not remember injury, does have yellowing around areas. No other bruising. Let me know if redness returns, any other areas develop. Check CBC.  Diagnoses and all orders for this visit:  Bruising -     CBC with Differential/Platelet  Follow up plan: prn Assunta Found, MD [place]

## 2016-06-03 LAB — CBC WITH DIFFERENTIAL/PLATELET
BASOS: 0 %
Basophils Absolute: 0 10*3/uL (ref 0.0–0.2)
EOS (ABSOLUTE): 0.2 10*3/uL (ref 0.0–0.4)
Eos: 4 %
HEMOGLOBIN: 13.5 g/dL (ref 11.1–15.9)
Hematocrit: 41.3 % (ref 34.0–46.6)
IMMATURE GRANS (ABS): 0 10*3/uL (ref 0.0–0.1)
IMMATURE GRANULOCYTES: 0 %
LYMPHS: 53 %
Lymphocytes Absolute: 3 10*3/uL (ref 0.7–3.1)
MCH: 30.1 pg (ref 26.6–33.0)
MCHC: 32.7 g/dL (ref 31.5–35.7)
MCV: 92 fL (ref 79–97)
MONOCYTES: 10 %
Monocytes Absolute: 0.6 10*3/uL (ref 0.1–0.9)
NEUTROS ABS: 1.9 10*3/uL (ref 1.4–7.0)
Neutrophils: 33 %
Platelets: 235 10*3/uL (ref 150–379)
RBC: 4.49 x10E6/uL (ref 3.77–5.28)
RDW: 13.9 % (ref 12.3–15.4)
WBC: 5.6 10*3/uL (ref 3.4–10.8)

## 2016-06-07 ENCOUNTER — Ambulatory Visit (INDEPENDENT_AMBULATORY_CARE_PROVIDER_SITE_OTHER): Payer: Medicare Other

## 2016-06-07 DIAGNOSIS — Z23 Encounter for immunization: Secondary | ICD-10-CM

## 2016-06-28 ENCOUNTER — Other Ambulatory Visit: Payer: Self-pay | Admitting: Family Medicine

## 2016-06-28 DIAGNOSIS — Z1231 Encounter for screening mammogram for malignant neoplasm of breast: Secondary | ICD-10-CM

## 2016-07-07 ENCOUNTER — Ambulatory Visit (HOSPITAL_COMMUNITY)
Admission: RE | Admit: 2016-07-07 | Discharge: 2016-07-07 | Disposition: A | Discharge status: Home / Self Care | Payer: Medicare Other | Source: Ambulatory Visit | Attending: Family Medicine | Admitting: Family Medicine

## 2016-07-07 DIAGNOSIS — Z1231 Encounter for screening mammogram for malignant neoplasm of breast: Secondary | ICD-10-CM | POA: Insufficient documentation

## 2016-07-25 ENCOUNTER — Other Ambulatory Visit: Payer: Self-pay | Admitting: Family Medicine

## 2016-08-02 DIAGNOSIS — Z1231 Encounter for screening mammogram for malignant neoplasm of breast: Secondary | ICD-10-CM | POA: Diagnosis not present

## 2016-08-03 DIAGNOSIS — Z1231 Encounter for screening mammogram for malignant neoplasm of breast: Secondary | ICD-10-CM | POA: Diagnosis not present

## 2016-08-04 DIAGNOSIS — J329 Chronic sinusitis, unspecified: Secondary | ICD-10-CM | POA: Diagnosis not present

## 2016-08-04 DIAGNOSIS — R21 Rash and other nonspecific skin eruption: Secondary | ICD-10-CM | POA: Diagnosis not present

## 2016-08-04 DIAGNOSIS — Z0000 Encounter for general adult medical examination without abnormal findings: Secondary | ICD-10-CM | POA: Diagnosis not present

## 2016-08-04 DIAGNOSIS — E139 Other specified diabetes mellitus without complications: Secondary | ICD-10-CM | POA: Diagnosis not present

## 2016-08-04 DIAGNOSIS — I10 Essential (primary) hypertension: Secondary | ICD-10-CM | POA: Diagnosis not present

## 2016-08-04 DIAGNOSIS — E7800 Pure hypercholesterolemia, unspecified: Secondary | ICD-10-CM | POA: Diagnosis not present

## 2016-08-29 ENCOUNTER — Telehealth: Payer: Self-pay | Admitting: Family Medicine

## 2016-08-30 ENCOUNTER — Encounter: Payer: Self-pay | Admitting: Family Medicine

## 2016-08-30 ENCOUNTER — Ambulatory Visit (INDEPENDENT_AMBULATORY_CARE_PROVIDER_SITE_OTHER): Payer: Medicare Other | Admitting: Family Medicine

## 2016-08-30 VITALS — BP 131/76 | HR 99 | Temp 97.8°F | Ht 67.0 in | Wt 167.8 lb

## 2016-08-30 DIAGNOSIS — L089 Local infection of the skin and subcutaneous tissue, unspecified: Secondary | ICD-10-CM

## 2016-08-30 DIAGNOSIS — I10 Essential (primary) hypertension: Secondary | ICD-10-CM

## 2016-08-30 DIAGNOSIS — E1142 Type 2 diabetes mellitus with diabetic polyneuropathy: Secondary | ICD-10-CM | POA: Diagnosis not present

## 2016-08-30 LAB — BAYER DCA HB A1C WAIVED: HB A1C: 5.6 % (ref <7.0)

## 2016-08-30 MED ORDER — DOXYCYCLINE HYCLATE 100 MG PO TABS: 100.0000 mg | ORAL_TABLET | Freq: Two times a day (BID) | ORAL | 0 refills | Status: DC

## 2016-08-30 MED ORDER — AMOXICILLIN-POT CLAVULANATE 875-125 MG PO TABS: 1.0000 | ORAL_TABLET | Freq: Two times a day (BID) | ORAL | 0 refills | Status: DC

## 2016-08-30 NOTE — Patient Instructions (Signed)
Great to see you!  Take all antibiotics, follow up right away if the areas come back after you finish them.

## 2016-08-30 NOTE — Progress Notes (Signed)
   HPI  Patient presents today here to follow-up for recent skin infection.  Patient states that about 3 weeks ago she was seen in family practice in [place] while she was visiting her husband who has prostate cancer. She states she was diagnosed with a severe infection of her legs and treated with Augmentin plus doxycycline. She had complete resolution of symptoms which then returned about 5-6 days after she finished the course of antibiotics.  She states the current lesions have been there for 5-6 days currently.  She has occasional chills, however denies any fevers, sweats, or malaise. She's tolerating food and fluids normally.  Patient is taking Glucophage for diabetes, watching her diet as well  PMH: Smoking status noted ROS: Per HPI  Objective: BP 131/76   Pulse 99   Temp 97.8 F (36.6 C) (Oral)   Ht 5\' 7"  (1.702 m)   Wt 167 lb 12.8 oz (76.1 kg)   BMI 26.28 kg/m  Gen: NAD, alert, cooperative with exam HEENT: NCAT CV: RRR, good S1/S2, no murmur Resp: CTABL, no wheezes, non-labored Ext: No edema, warm Neuro: Alert and oriented, No gross deficits Skin:  # 5-10,  2 cm to 5 cm slightly subcutaneous nodules with the appearance of very faint bruises, slightly hyperpigmented. Distributed acorss BL LE No tenderness to palpation No areas of fluctuance or drainage. No warmth over the areas.  Assessment and plan:  # Skin infection Unclear etiology, however patient had very good improvement with Augmentin plus doxycycline I'm intrigued to find out what the other physician was interested in is the most likely etiology, we have requested specifically this office note. Patient appears very well without any apparent systemic symptoms. She has no tenderness or warmth over any of the areas. Treat with Augmentin plus doxycycline Return to clinic with any concern or very quickly if symptoms return after she finishes the course.  T2DM Well controled on  metformin Continue Recheck A1C in 3-4 months  HTN Well controlled on amlodipine- continue    Orders Placed This Encounter  Procedures  . Bayer DCA Hb A1c Waived    Meds ordered this encounter  Medications  . amoxicillin-clavulanate (AUGMENTIN) 875-125 MG tablet    Sig: Take 1 tablet by mouth 2 (two) times daily.    Dispense:  20 tablet    Refill:  0  . doxycycline (VIBRA-TABS) 100 MG tablet    Sig: Take 1 tablet (100 mg total) by mouth 2 (two) times daily. 1 po bid    Dispense:  20 tablet    Refill:  0    Laroy Apple, MD [facility] 08/30/2016, 4:14 PM

## 2016-09-09 ENCOUNTER — Other Ambulatory Visit: Payer: Self-pay | Admitting: Family Medicine

## 2016-09-09 NOTE — Telephone Encounter (Signed)
Refill on xanax called to voice mail.

## 2016-09-13 ENCOUNTER — Other Ambulatory Visit: Payer: Medicare Other

## 2016-10-05 DIAGNOSIS — R110 Nausea: Secondary | ICD-10-CM | POA: Diagnosis not present

## 2016-10-05 DIAGNOSIS — J069 Acute upper respiratory infection, unspecified: Secondary | ICD-10-CM | POA: Diagnosis not present

## 2016-11-16 ENCOUNTER — Emergency Department (HOSPITAL_COMMUNITY)
Admission: EM | Admit: 2016-11-16 | Discharge: 2016-11-16 | Disposition: A | Discharge status: Home / Self Care | Payer: Medicare Other | Attending: Emergency Medicine | Admitting: Emergency Medicine

## 2016-11-16 ENCOUNTER — Encounter (HOSPITAL_COMMUNITY): Payer: Self-pay | Admitting: Cardiology

## 2016-11-16 DIAGNOSIS — Z7984 Long term (current) use of oral hypoglycemic drugs: Secondary | ICD-10-CM | POA: Insufficient documentation

## 2016-11-16 DIAGNOSIS — E119 Type 2 diabetes mellitus without complications: Secondary | ICD-10-CM | POA: Insufficient documentation

## 2016-11-16 DIAGNOSIS — Z87891 Personal history of nicotine dependence: Secondary | ICD-10-CM | POA: Diagnosis not present

## 2016-11-16 DIAGNOSIS — I10 Essential (primary) hypertension: Secondary | ICD-10-CM | POA: Insufficient documentation

## 2016-11-16 DIAGNOSIS — L309 Dermatitis, unspecified: Secondary | ICD-10-CM | POA: Diagnosis not present

## 2016-11-16 DIAGNOSIS — Z79899 Other long term (current) drug therapy: Secondary | ICD-10-CM | POA: Diagnosis not present

## 2016-11-16 DIAGNOSIS — M7989 Other specified soft tissue disorders: Secondary | ICD-10-CM | POA: Diagnosis present

## 2016-11-16 LAB — COMPREHENSIVE METABOLIC PANEL
ALK PHOS: 58 U/L (ref 38–126)
ALT: 25 U/L (ref 14–54)
AST: 53 U/L — AB (ref 15–41)
Albumin: 3.4 g/dL — ABNORMAL LOW (ref 3.5–5.0)
Anion gap: 6 (ref 5–15)
BILIRUBIN TOTAL: 0.5 mg/dL (ref 0.3–1.2)
BUN: 17 mg/dL (ref 6–20)
CALCIUM: 8.6 mg/dL — AB (ref 8.9–10.3)
CO2: 25 mmol/L (ref 22–32)
CREATININE: 0.8 mg/dL (ref 0.44–1.00)
Chloride: 103 mmol/L (ref 101–111)
GFR calc Af Amer: >60 mL/min (ref >60)
GFR calc non Af Amer: >60 mL/min (ref >60)
GLUCOSE: 85 mg/dL (ref 65–99)
Potassium: 3.5 mmol/L (ref 3.5–5.1)
Sodium: 134 mmol/L — ABNORMAL LOW (ref 135–145)
TOTAL PROTEIN: 6.9 g/dL (ref 6.5–8.1)

## 2016-11-16 LAB — CBC WITH DIFFERENTIAL/PLATELET
BASOS ABS: 0 10*3/uL (ref 0.0–0.1)
BASOS PCT: 0 %
Eosinophils Absolute: 0.2 10*3/uL (ref 0.0–0.7)
Eosinophils Relative: 3 %
HEMATOCRIT: 40.5 % (ref 36.0–46.0)
HEMOGLOBIN: 13.6 g/dL (ref 12.0–15.0)
Lymphocytes Relative: 53 %
Lymphs Abs: 2.8 10*3/uL (ref 0.7–4.0)
MCH: 30.2 pg (ref 26.0–34.0)
MCHC: 33.6 g/dL (ref 30.0–36.0)
MCV: 89.8 fL (ref 78.0–100.0)
MONOS PCT: 11 %
Monocytes Absolute: 0.6 10*3/uL (ref 0.1–1.0)
NEUTROS ABS: 1.8 10*3/uL (ref 1.7–7.7)
NEUTROS PCT: 33 %
Platelets: 210 10*3/uL (ref 150–400)
RBC: 4.51 MIL/uL (ref 3.87–5.11)
RDW: 14.1 % (ref 11.5–15.5)
WBC: 5.3 10*3/uL (ref 4.0–10.5)

## 2016-11-16 MED ORDER — PREDNISONE 10 MG PO TABS: 20.0000 mg | ORAL_TABLET | Freq: Every day | ORAL | 0 refills | Status: DC

## 2016-11-16 NOTE — ED Provider Notes (Signed)
[facility] DEPT Provider Note   CSN: [id number] Arrival date & time: 11/16/16  1734 By signing my name below, I, Dyke Brackett, attest that this documentation has been prepared under the direction and in the presence of Milton Ferguson, MD . Electronically Signed: Dyke Brackett, Scribe. 11/16/2016. 8:40 PM.   History   Chief Complaint Chief Complaint  Patient presents with  . Leg Swelling    HPI Tammy Hudson is a 66 y.o. female with a history of DM, HLD and HTN who presents to the Emergency Department complaining of a moderate, gradually worsening rash to her bilateral legs since 10/17. She reports associated erythema and itching to the area. No alleviating or modifying factors noted. She denies any pain or weeping to the area. She denies the use of anti-coagulants. Pt has no other acute complaints or associated symptoms at this time.   The history is provided by the patient. No language interpreter was used.  Rash   This is a recurrent problem. The current episode started more than 1 week ago. The problem has not changed since onset.The problem is associated with nothing. There has been no fever. The pain is at a severity of 0/10. The patient is experiencing no pain. The pain has been constant since onset. Associated symptoms include blisters.    Past Medical History:  Diagnosis Date  . Allergy   . Anxiety   . Arthritis   . Depression   . Diabetes mellitus without complication ([place])   . GERD (gastroesophageal reflux disease)   . Hyperlipidemia   . Hypertension   . Neuromuscular disorder [facility])     Patient Active Problem List   Diagnosis Date Noted  . Left-sided low back pain with left-sided sciatica 02/15/2016  . HLD (hyperlipidemia) 01/29/2016  . Vitamin D deficiency 01/29/2016  . Essential hypertension 04/02/2015  . Type 2 diabetes mellitus with peripheral neuropathy ([place]) 04/02/2015  . Allergic rhinitis 04/02/2015    Past Surgical History:  Procedure Laterality  Date  . ABDOMINAL HYSTERECTOMY      OB History    No data available     Home Medications    Prior to Admission medications   Medication Sig Start Date End Date Taking? Authorizing Provider  ALPRAZolam Duanne Moron) 0.25 MG tablet TAKE ONE TABLET BY MOUTH TWICE DAILY AS NEEDED 09/09/16   Timmothy Euler, MD  amLODipine (NORVASC) 5 MG tablet Take 1 tablet (5 mg total) by mouth daily. 04/08/15   Tiffany A Gann, PA-C  amoxicillin-clavulanate (AUGMENTIN) 875-125 MG tablet Take 1 tablet by mouth 2 (two) times daily. 08/30/16   Timmothy Euler, MD  doxycycline (VIBRA-TABS) 100 MG tablet Take 1 tablet (100 mg total) by mouth 2 (two) times daily. 1 po bid 08/30/16   Timmothy Euler, MD  fexofenadine (ALLEGRA) 180 MG tablet Take 1 tablet (180 mg total) by mouth daily. 02/15/16   Timmothy Euler, MD  gabapentin (NEURONTIN) 300 MG capsule Take 1 capsule (300 mg total) by mouth 3 (three) times daily. 02/15/16   Timmothy Euler, MD  metFORMIN (GLUCOPHAGE) 500 MG tablet Take 1 tablet (500 mg total) by mouth daily. 04/08/15   Tiffany A Gann, PA-C  mirtazapine (REMERON) 15 MG tablet Take 1 tablet (15 mg total) by mouth at bedtime. Take 1 and one half tablets every night at bed time. 04/08/15   Tiffany A Gann, PA-C  naproxen (NAPROSYN) 500 MG tablet Take 500 mg by mouth 2 (two) times daily. 02/12/15   Historical Provider, MD  propranolol (INDERAL) 10 MG tablet Take 1 tablet (10 mg total) by mouth 3 (three) times daily as needed. 02/15/16   Timmothy Euler, MD  simvastatin (ZOCOR) 10 MG tablet Take 1 tablet (10 mg total) by mouth at bedtime. 01/05/16   Timmothy Euler, MD    Family History History reviewed. No pertinent family history.  Social History Social History  Substance Use Topics  . Smoking status: Former Smoker    Quit date: 04/02/1995  . Smokeless tobacco: Never Used  . Alcohol use No    Allergies   Patient has no known allergies.  Review of Systems Review of Systems  Constitutional:  Negative for appetite change and fatigue.  HENT: Negative for congestion, ear discharge and sinus pressure.   Eyes: Negative for discharge.  Respiratory: Negative for cough.   Cardiovascular: Negative for chest pain.  Gastrointestinal: Negative for abdominal pain and diarrhea.  Genitourinary: Negative for frequency and hematuria.  Musculoskeletal: Negative for back pain.  Skin: Positive for color change and rash.  Neurological: Negative for seizures and headaches.  Psychiatric/Behavioral: Negative for hallucinations.   Physical Exam Updated Vital Signs BP (!) 142/72   Pulse 82   Temp 98.5 F (36.9 C) (Oral)   Resp 16   Ht 5\' 7"  (1.702 m)   Wt 160 lb (72.6 kg)   SpO2 94%   BMI 25.06 kg/m   Physical Exam  Constitutional: She is oriented to person, place, and time. She appears well-developed.  HENT:  Head: Normocephalic.  Eyes: Conjunctivae and EOM are normal. No scleral icterus.  Neck: Neck supple. No thyromegaly present.  Cardiovascular: Normal rate and regular rhythm.  Exam reveals no gallop and no friction rub.   No murmur heard. Pulmonary/Chest: No stridor. She has no wheezes. She has no rales. She exhibits no tenderness.  Abdominal: She exhibits no distension. There is no tenderness. There is no rebound.  Musculoskeletal: Normal range of motion. She exhibits no edema.  Lymphadenopathy:    She has no cervical adenopathy.  Neurological: She is oriented to person, place, and time. She exhibits normal muscle tone. Coordination normal.  Skin: Rash noted. Rash is maculopapular. No erythema.  Maculopapular rash from groin distally bilaterally  Psychiatric: She has a normal mood and affect. Her behavior is normal.   ED Treatments / Results  DIAGNOSTIC STUDIES:  Oxygen Saturation is 96% on RA, normal by my interpretation.    COORDINATION OF CARE:  8:35 PM Discussed treatment plan with pt at bedside and pt agreed to plan.   Labs (all labs ordered are listed, but only  abnormal results are displayed) Labs Reviewed - No data to display  EKG  EKG Interpretation None       Radiology No results found.  Procedures Procedures (including critical care time)  Medications Ordered in ED Medications - No data to display   Initial Impression / Assessment and Plan / ED Course  I have reviewed the triage vital signs and the nursing notes.  Pertinent labs & imaging results that were available during my care of the patient were reviewed by me and considered in my medical decision making (see chart for details).    Dermatitis.  rx prednisone and follow up  Final Clinical Impressions(s) / ED Diagnoses   Final diagnoses:  None    New Prescriptions New Prescriptions   No medications on file   The chart was scribed for me under my direct supervision.  I personally performed the history, physical, and medical decision  making and all procedures in the evaluation of this patient.Milton Ferguson, MD 11/18/16 [phone]

## 2016-11-16 NOTE — ED Triage Notes (Signed)
Positive bilateral DP pulses

## 2016-11-16 NOTE — ED Triage Notes (Signed)
Bilateral leg swelling and rash times 4 months.  Worse now.

## 2016-11-16 NOTE — Discharge Instructions (Signed)
Follow-up with Dr. Nevada Crane the dermatologist in [place]

## 2016-11-16 NOTE — ED Notes (Signed)
Pt reports she has had a red and purple circular diffuse rash to bilateral legs appear in October, went away for two months and returned Monday. Denies pain, itching, weeping, or pain; denies blood thinner use. Endorses leg swelling at times.

## 2016-12-05 ENCOUNTER — Other Ambulatory Visit: Payer: Self-pay | Admitting: Family Medicine

## 2016-12-05 DIAGNOSIS — L958 Other vasculitis limited to the skin: Secondary | ICD-10-CM | POA: Diagnosis not present

## 2016-12-06 DIAGNOSIS — L958 Other vasculitis limited to the skin: Secondary | ICD-10-CM | POA: Diagnosis not present

## 2016-12-22 DIAGNOSIS — L958 Other vasculitis limited to the skin: Secondary | ICD-10-CM | POA: Diagnosis not present

## 2017-01-19 DIAGNOSIS — L958 Other vasculitis limited to the skin: Secondary | ICD-10-CM | POA: Diagnosis not present

## 2017-01-31 DIAGNOSIS — Z1211 Encounter for screening for malignant neoplasm of colon: Secondary | ICD-10-CM | POA: Diagnosis not present

## 2017-01-31 DIAGNOSIS — L959 Vasculitis limited to the skin, unspecified: Secondary | ICD-10-CM | POA: Diagnosis not present

## 2017-01-31 DIAGNOSIS — R5383 Other fatigue: Secondary | ICD-10-CM | POA: Diagnosis not present

## 2017-01-31 DIAGNOSIS — E1169 Type 2 diabetes mellitus with other specified complication: Secondary | ICD-10-CM | POA: Diagnosis not present

## 2017-01-31 DIAGNOSIS — F411 Generalized anxiety disorder: Secondary | ICD-10-CM | POA: Diagnosis not present

## 2017-01-31 DIAGNOSIS — I10 Essential (primary) hypertension: Secondary | ICD-10-CM | POA: Diagnosis not present

## 2017-01-31 DIAGNOSIS — M2550 Pain in unspecified joint: Secondary | ICD-10-CM | POA: Diagnosis not present

## 2017-01-31 DIAGNOSIS — E785 Hyperlipidemia, unspecified: Secondary | ICD-10-CM | POA: Diagnosis not present

## 2017-02-02 ENCOUNTER — Encounter (INDEPENDENT_AMBULATORY_CARE_PROVIDER_SITE_OTHER): Payer: Self-pay | Admitting: *Deleted

## 2017-02-14 ENCOUNTER — Other Ambulatory Visit (HOSPITAL_COMMUNITY): Payer: Self-pay | Admitting: Adult Health Nurse Practitioner

## 2017-02-14 DIAGNOSIS — Z0000 Encounter for general adult medical examination without abnormal findings: Secondary | ICD-10-CM | POA: Diagnosis not present

## 2017-02-14 DIAGNOSIS — Z780 Asymptomatic menopausal state: Secondary | ICD-10-CM | POA: Diagnosis not present

## 2017-02-14 DIAGNOSIS — Z23 Encounter for immunization: Secondary | ICD-10-CM | POA: Diagnosis not present

## 2017-02-16 ENCOUNTER — Encounter (INDEPENDENT_AMBULATORY_CARE_PROVIDER_SITE_OTHER): Payer: Self-pay

## 2017-02-16 ENCOUNTER — Encounter (INDEPENDENT_AMBULATORY_CARE_PROVIDER_SITE_OTHER): Payer: Self-pay | Admitting: Internal Medicine

## 2017-02-21 DIAGNOSIS — M2550 Pain in unspecified joint: Secondary | ICD-10-CM | POA: Diagnosis not present

## 2017-02-21 DIAGNOSIS — R768 Other specified abnormal immunological findings in serum: Secondary | ICD-10-CM | POA: Diagnosis not present

## 2017-02-21 DIAGNOSIS — M549 Dorsalgia, unspecified: Secondary | ICD-10-CM | POA: Diagnosis not present

## 2017-02-21 DIAGNOSIS — R21 Rash and other nonspecific skin eruption: Secondary | ICD-10-CM | POA: Diagnosis not present

## 2017-02-23 DIAGNOSIS — L958 Other vasculitis limited to the skin: Secondary | ICD-10-CM | POA: Diagnosis not present

## 2017-03-02 ENCOUNTER — Ambulatory Visit (INDEPENDENT_AMBULATORY_CARE_PROVIDER_SITE_OTHER): Payer: Medicare Other | Admitting: Internal Medicine

## 2017-03-02 ENCOUNTER — Telehealth (INDEPENDENT_AMBULATORY_CARE_PROVIDER_SITE_OTHER): Payer: Self-pay | Admitting: Internal Medicine

## 2017-03-02 ENCOUNTER — Encounter (INDEPENDENT_AMBULATORY_CARE_PROVIDER_SITE_OTHER): Payer: Self-pay | Admitting: Internal Medicine

## 2017-03-02 VITALS — BP 150/82 | HR 64 | Temp 97.5°F | Ht 67.0 in | Wt 159.8 lb

## 2017-03-02 DIAGNOSIS — R21 Rash and other nonspecific skin eruption: Secondary | ICD-10-CM

## 2017-03-02 NOTE — Telephone Encounter (Signed)
Please send copy of this note to Dr. Edrick Oh

## 2017-03-02 NOTE — Progress Notes (Addendum)
Subjective:    Patient ID: Tammy Hudson, female    DOB: 01-Sep-1950, 66 y.o.   MRN: [id number]  HPI Referred by Dr. Edrick Oh. She tells me she needs a colonoscopy. She has a rash. She has been on Prednisone for the rash . She says she has an infection in her body.    Her last colonoscopy was in May of 2014 and found to have diverticula which appeared to moderate severity. A single sessile 3 mm polyp of benign appearance in the sigmoid colon. Polyp was completely removed and retrieved.  [place], [place], [place].  Biopsy: Hyperplastic polyp.     She tells me she feel okay. Fine, red rash to her lower extremities. She says her feet are numb. She has tenderness in her rt hip. No change in her stool habits.  No abdominal pain.  She has a BM daily. No melena or BRRB.  Hx of diabetes x 1 yr.  01/31/2017 RA quaint 122.6  H and H 11.1 and 34.0, ANA direct positive, Anti-DNA (DS) less than one. sedrate 17 (normal) Review of Systems Past Medical History:  Diagnosis Date  . Allergy   . Anxiety   . Arthritis   . Depression   . Diabetes mellitus without complication ([place])   . GERD (gastroesophageal reflux disease)   . Hyperlipidemia   . Hypertension   . Neuromuscular disorder [facility])     Past Surgical History:  Procedure Laterality Date  . ABDOMINAL HYSTERECTOMY      No Known Allergies  Current Outpatient Prescriptions on File Prior to Visit  Medication Sig Dispense Refill  . ALPRAZolam (XANAX) 0.25 MG tablet TAKE 1 TABLET BY MOUTH TWICE DAILY AS NEEDED 60 tablet 2  . gabapentin (NEURONTIN) 300 MG capsule Take 1 capsule (300 mg total) by mouth 3 (three) times daily. (Patient taking differently: Take 300 mg by mouth daily as needed (for neuropathy pain). ) 90 capsule 5  . metFORMIN (GLUCOPHAGE) 500 MG tablet Take 1 tablet (500 mg total) by mouth daily. 30 tablet 2  . mirtazapine (REMERON) 15 MG tablet Take 1 tablet (15 mg total) by mouth at bedtime. Take 1 and one half  tablets every night at bed time. (Patient taking differently: Take 7.5-15 mg by mouth at bedtime. ) 45 tablet 2  . naproxen (NAPROSYN) 500 MG tablet Take 500 mg by mouth daily as needed for mild pain or moderate pain.     Marland Kitchen propranolol (INDERAL) 10 MG tablet Take 1 tablet (10 mg total) by mouth 3 (three) times daily as needed. (Patient taking differently: Take 10 mg by mouth daily. ) 90 tablet 5  . simvastatin (ZOCOR) 10 MG tablet Take 1 tablet (10 mg total) by mouth at bedtime. 90 tablet 3   No current facility-administered medications on file prior to visit.         Objective:   Physical Exam Blood pressure (!) 150/82, pulse 64, temperature (!) 97.5 F (36.4 C), height 5\' 7"  (1.702 m), weight 159 lb 12.8 oz (72.5 kg).  Alert and oriented. Skin warm and dry. Oral mucosa is moist.   . Sclera anicteric, conjunctivae is pink. Thyroid not enlarged. No cervical lymphadenopathy. Lungs clear. Heart regular rate and rhythm.  Abdomen is soft. Bowel sounds are positive. No hepatomegaly. No abdominal masses felt. No tenderness.  No edema to lower extremities.          Assessment & Plan:  Rash. She is presently being worked up by Dr. Edrick Oh  for this. Has been on Prednisone. States her RA lab came back positive. Will get labs from Dr. Edrick Oh. Will give patient a copy of her colonoscopy.

## 2017-03-02 NOTE — Telephone Encounter (Signed)
Noted faxed

## 2017-03-02 NOTE — Patient Instructions (Signed)
OV as needed 

## 2017-03-03 ENCOUNTER — Other Ambulatory Visit (HOSPITAL_COMMUNITY): Payer: Medicare Other

## 2017-03-07 DIAGNOSIS — R21 Rash and other nonspecific skin eruption: Secondary | ICD-10-CM | POA: Diagnosis not present

## 2017-03-07 DIAGNOSIS — M2550 Pain in unspecified joint: Secondary | ICD-10-CM | POA: Diagnosis not present

## 2017-03-07 DIAGNOSIS — M549 Dorsalgia, unspecified: Secondary | ICD-10-CM | POA: Diagnosis not present

## 2017-03-07 DIAGNOSIS — R768 Other specified abnormal immunological findings in serum: Secondary | ICD-10-CM | POA: Diagnosis not present

## 2017-03-15 ENCOUNTER — Ambulatory Visit (HOSPITAL_COMMUNITY)
Admission: RE | Admit: 2017-03-15 | Discharge: 2017-03-15 | Disposition: A | Discharge status: Home / Self Care | Payer: Medicare Other | Source: Ambulatory Visit | Attending: Adult Health Nurse Practitioner | Admitting: Adult Health Nurse Practitioner

## 2017-03-15 DIAGNOSIS — M8589 Other specified disorders of bone density and structure, multiple sites: Secondary | ICD-10-CM | POA: Diagnosis not present

## 2017-03-15 DIAGNOSIS — M8588 Other specified disorders of bone density and structure, other site: Secondary | ICD-10-CM | POA: Insufficient documentation

## 2017-03-15 DIAGNOSIS — Z780 Asymptomatic menopausal state: Secondary | ICD-10-CM

## 2017-03-23 DIAGNOSIS — L958 Other vasculitis limited to the skin: Secondary | ICD-10-CM | POA: Diagnosis not present

## 2017-03-23 DIAGNOSIS — M310 Hypersensitivity angiitis: Secondary | ICD-10-CM | POA: Diagnosis not present

## 2017-04-04 DIAGNOSIS — R768 Other specified abnormal immunological findings in serum: Secondary | ICD-10-CM | POA: Diagnosis not present

## 2017-04-04 DIAGNOSIS — M549 Dorsalgia, unspecified: Secondary | ICD-10-CM | POA: Diagnosis not present

## 2017-04-04 DIAGNOSIS — M2550 Pain in unspecified joint: Secondary | ICD-10-CM | POA: Diagnosis not present

## 2017-04-04 DIAGNOSIS — R21 Rash and other nonspecific skin eruption: Secondary | ICD-10-CM | POA: Diagnosis not present

## 2017-04-04 DIAGNOSIS — R079 Chest pain, unspecified: Secondary | ICD-10-CM | POA: Diagnosis not present

## 2017-04-25 DIAGNOSIS — M2550 Pain in unspecified joint: Secondary | ICD-10-CM | POA: Diagnosis not present

## 2017-04-25 DIAGNOSIS — R21 Rash and other nonspecific skin eruption: Secondary | ICD-10-CM | POA: Diagnosis not present

## 2017-04-25 DIAGNOSIS — I776 Arteritis, unspecified: Secondary | ICD-10-CM | POA: Diagnosis not present

## 2017-04-25 DIAGNOSIS — M059 Rheumatoid arthritis with rheumatoid factor, unspecified: Secondary | ICD-10-CM | POA: Diagnosis not present

## 2017-04-25 DIAGNOSIS — M25473 Effusion, unspecified ankle: Secondary | ICD-10-CM | POA: Diagnosis not present

## 2017-04-25 DIAGNOSIS — R768 Other specified abnormal immunological findings in serum: Secondary | ICD-10-CM | POA: Diagnosis not present

## 2017-04-25 DIAGNOSIS — Z79899 Other long term (current) drug therapy: Secondary | ICD-10-CM | POA: Diagnosis not present

## 2017-04-25 DIAGNOSIS — M549 Dorsalgia, unspecified: Secondary | ICD-10-CM | POA: Diagnosis not present

## 2017-05-02 ENCOUNTER — Other Ambulatory Visit: Payer: Self-pay | Admitting: Family Medicine

## 2017-05-03 ENCOUNTER — Other Ambulatory Visit: Payer: Self-pay | Admitting: Family Medicine

## 2017-05-04 ENCOUNTER — Other Ambulatory Visit: Payer: Self-pay | Admitting: Family Medicine

## 2017-05-15 DIAGNOSIS — I10 Essential (primary) hypertension: Secondary | ICD-10-CM | POA: Diagnosis not present

## 2017-05-15 DIAGNOSIS — F411 Generalized anxiety disorder: Secondary | ICD-10-CM | POA: Diagnosis not present

## 2017-05-15 DIAGNOSIS — F410 Panic disorder [episodic paroxysmal anxiety] without agoraphobia: Secondary | ICD-10-CM | POA: Diagnosis not present

## 2017-05-29 DIAGNOSIS — F411 Generalized anxiety disorder: Secondary | ICD-10-CM | POA: Diagnosis not present

## 2017-05-29 DIAGNOSIS — J309 Allergic rhinitis, unspecified: Secondary | ICD-10-CM | POA: Diagnosis not present

## 2017-05-29 DIAGNOSIS — Z1159 Encounter for screening for other viral diseases: Secondary | ICD-10-CM | POA: Diagnosis not present

## 2017-05-29 DIAGNOSIS — F410 Panic disorder [episodic paroxysmal anxiety] without agoraphobia: Secondary | ICD-10-CM | POA: Diagnosis not present

## 2017-05-29 DIAGNOSIS — Z79899 Other long term (current) drug therapy: Secondary | ICD-10-CM | POA: Diagnosis not present

## 2017-05-29 DIAGNOSIS — Z23 Encounter for immunization: Secondary | ICD-10-CM | POA: Diagnosis not present

## 2017-06-13 DIAGNOSIS — M2550 Pain in unspecified joint: Secondary | ICD-10-CM | POA: Diagnosis not present

## 2017-06-13 DIAGNOSIS — M549 Dorsalgia, unspecified: Secondary | ICD-10-CM | POA: Diagnosis not present

## 2017-06-13 DIAGNOSIS — I776 Arteritis, unspecified: Secondary | ICD-10-CM | POA: Diagnosis not present

## 2017-06-13 DIAGNOSIS — Z79899 Other long term (current) drug therapy: Secondary | ICD-10-CM | POA: Diagnosis not present

## 2017-06-13 DIAGNOSIS — M542 Cervicalgia: Secondary | ICD-10-CM | POA: Diagnosis not present

## 2017-06-13 DIAGNOSIS — M05772 Rheumatoid arthritis with rheumatoid factor of left ankle and foot without organ or systems involvement: Secondary | ICD-10-CM | POA: Diagnosis not present

## 2017-06-13 DIAGNOSIS — R21 Rash and other nonspecific skin eruption: Secondary | ICD-10-CM | POA: Diagnosis not present

## 2017-06-13 DIAGNOSIS — R768 Other specified abnormal immunological findings in serum: Secondary | ICD-10-CM | POA: Diagnosis not present

## 2017-06-13 DIAGNOSIS — M25473 Effusion, unspecified ankle: Secondary | ICD-10-CM | POA: Diagnosis not present

## 2017-08-08 DIAGNOSIS — M05772 Rheumatoid arthritis with rheumatoid factor of left ankle and foot without organ or systems involvement: Secondary | ICD-10-CM | POA: Diagnosis not present

## 2017-08-08 DIAGNOSIS — R768 Other specified abnormal immunological findings in serum: Secondary | ICD-10-CM | POA: Diagnosis not present

## 2017-08-08 DIAGNOSIS — R21 Rash and other nonspecific skin eruption: Secondary | ICD-10-CM | POA: Diagnosis not present

## 2017-08-08 DIAGNOSIS — I776 Arteritis, unspecified: Secondary | ICD-10-CM | POA: Diagnosis not present

## 2017-08-08 DIAGNOSIS — M2550 Pain in unspecified joint: Secondary | ICD-10-CM | POA: Diagnosis not present

## 2017-08-08 DIAGNOSIS — M25473 Effusion, unspecified ankle: Secondary | ICD-10-CM | POA: Diagnosis not present

## 2017-08-08 DIAGNOSIS — M549 Dorsalgia, unspecified: Secondary | ICD-10-CM | POA: Diagnosis not present

## 2017-08-08 DIAGNOSIS — Z79899 Other long term (current) drug therapy: Secondary | ICD-10-CM | POA: Diagnosis not present

## 2017-08-10 ENCOUNTER — Other Ambulatory Visit (HOSPITAL_COMMUNITY): Payer: Self-pay | Admitting: Physician Assistant

## 2017-08-10 DIAGNOSIS — Z1231 Encounter for screening mammogram for malignant neoplasm of breast: Secondary | ICD-10-CM

## 2017-08-16 ENCOUNTER — Ambulatory Visit (HOSPITAL_COMMUNITY)
Admission: RE | Admit: 2017-08-16 | Discharge: 2017-08-16 | Disposition: A | Discharge status: Home / Self Care | Payer: Medicare Other | Source: Ambulatory Visit | Attending: Physician Assistant | Admitting: Physician Assistant

## 2017-08-16 DIAGNOSIS — Z1231 Encounter for screening mammogram for malignant neoplasm of breast: Secondary | ICD-10-CM | POA: Diagnosis not present

## 2017-10-11 DIAGNOSIS — I776 Arteritis, unspecified: Secondary | ICD-10-CM | POA: Diagnosis not present

## 2017-10-11 DIAGNOSIS — M79643 Pain in unspecified hand: Secondary | ICD-10-CM | POA: Diagnosis not present

## 2017-10-11 DIAGNOSIS — M05772 Rheumatoid arthritis with rheumatoid factor of left ankle and foot without organ or systems involvement: Secondary | ICD-10-CM | POA: Diagnosis not present

## 2017-10-11 DIAGNOSIS — M549 Dorsalgia, unspecified: Secondary | ICD-10-CM | POA: Diagnosis not present

## 2017-10-11 DIAGNOSIS — Z79899 Other long term (current) drug therapy: Secondary | ICD-10-CM | POA: Diagnosis not present

## 2017-10-11 DIAGNOSIS — R062 Wheezing: Secondary | ICD-10-CM | POA: Diagnosis not present

## 2017-10-16 DIAGNOSIS — J302 Other seasonal allergic rhinitis: Secondary | ICD-10-CM | POA: Diagnosis not present

## 2017-10-16 DIAGNOSIS — J209 Acute bronchitis, unspecified: Secondary | ICD-10-CM | POA: Diagnosis not present

## 2017-10-16 DIAGNOSIS — I10 Essential (primary) hypertension: Secondary | ICD-10-CM | POA: Diagnosis not present

## 2017-11-02 DIAGNOSIS — F410 Panic disorder [episodic paroxysmal anxiety] without agoraphobia: Secondary | ICD-10-CM | POA: Diagnosis not present

## 2017-11-02 DIAGNOSIS — J309 Allergic rhinitis, unspecified: Secondary | ICD-10-CM | POA: Diagnosis not present

## 2017-11-02 DIAGNOSIS — E1169 Type 2 diabetes mellitus with other specified complication: Secondary | ICD-10-CM | POA: Diagnosis not present

## 2017-11-02 DIAGNOSIS — M5432 Sciatica, left side: Secondary | ICD-10-CM | POA: Diagnosis not present

## 2017-11-02 DIAGNOSIS — M5431 Sciatica, right side: Secondary | ICD-10-CM | POA: Diagnosis not present

## 2017-11-02 DIAGNOSIS — E785 Hyperlipidemia, unspecified: Secondary | ICD-10-CM | POA: Diagnosis not present

## 2017-11-02 DIAGNOSIS — I10 Essential (primary) hypertension: Secondary | ICD-10-CM | POA: Diagnosis not present

## 2017-11-02 DIAGNOSIS — F411 Generalized anxiety disorder: Secondary | ICD-10-CM | POA: Diagnosis not present

## 2017-11-03 DIAGNOSIS — I776 Arteritis, unspecified: Secondary | ICD-10-CM | POA: Diagnosis not present

## 2017-11-03 DIAGNOSIS — M859 Disorder of bone density and structure, unspecified: Secondary | ICD-10-CM | POA: Diagnosis not present

## 2017-11-03 DIAGNOSIS — M8580 Other specified disorders of bone density and structure, unspecified site: Secondary | ICD-10-CM | POA: Diagnosis not present

## 2017-11-03 DIAGNOSIS — M549 Dorsalgia, unspecified: Secondary | ICD-10-CM | POA: Diagnosis not present

## 2017-11-03 DIAGNOSIS — M79643 Pain in unspecified hand: Secondary | ICD-10-CM | POA: Diagnosis not present

## 2017-11-03 DIAGNOSIS — Z79899 Other long term (current) drug therapy: Secondary | ICD-10-CM | POA: Diagnosis not present

## 2017-11-03 DIAGNOSIS — M05772 Rheumatoid arthritis with rheumatoid factor of left ankle and foot without organ or systems involvement: Secondary | ICD-10-CM | POA: Diagnosis not present

## 2017-11-06 DIAGNOSIS — E785 Hyperlipidemia, unspecified: Secondary | ICD-10-CM | POA: Diagnosis not present

## 2017-11-06 DIAGNOSIS — Z1159 Encounter for screening for other viral diseases: Secondary | ICD-10-CM | POA: Diagnosis not present

## 2017-11-06 DIAGNOSIS — E1169 Type 2 diabetes mellitus with other specified complication: Secondary | ICD-10-CM | POA: Diagnosis not present

## 2017-11-06 DIAGNOSIS — I10 Essential (primary) hypertension: Secondary | ICD-10-CM | POA: Diagnosis not present

## 2018-01-05 DIAGNOSIS — M810 Age-related osteoporosis without current pathological fracture: Secondary | ICD-10-CM | POA: Diagnosis not present

## 2018-01-05 DIAGNOSIS — M05772 Rheumatoid arthritis with rheumatoid factor of left ankle and foot without organ or systems involvement: Secondary | ICD-10-CM | POA: Diagnosis not present

## 2018-01-05 DIAGNOSIS — M549 Dorsalgia, unspecified: Secondary | ICD-10-CM | POA: Diagnosis not present

## 2018-01-05 DIAGNOSIS — M79643 Pain in unspecified hand: Secondary | ICD-10-CM | POA: Diagnosis not present

## 2018-01-05 DIAGNOSIS — Z79899 Other long term (current) drug therapy: Secondary | ICD-10-CM | POA: Diagnosis not present

## 2018-01-05 DIAGNOSIS — I776 Arteritis, unspecified: Secondary | ICD-10-CM | POA: Diagnosis not present

## 2018-03-05 DIAGNOSIS — F410 Panic disorder [episodic paroxysmal anxiety] without agoraphobia: Secondary | ICD-10-CM | POA: Diagnosis not present

## 2018-03-05 DIAGNOSIS — F411 Generalized anxiety disorder: Secondary | ICD-10-CM | POA: Diagnosis not present

## 2018-03-05 DIAGNOSIS — E1169 Type 2 diabetes mellitus with other specified complication: Secondary | ICD-10-CM | POA: Diagnosis not present

## 2018-03-05 DIAGNOSIS — Z23 Encounter for immunization: Secondary | ICD-10-CM | POA: Diagnosis not present

## 2018-03-05 DIAGNOSIS — I10 Essential (primary) hypertension: Secondary | ICD-10-CM | POA: Diagnosis not present

## 2018-03-05 DIAGNOSIS — E785 Hyperlipidemia, unspecified: Secondary | ICD-10-CM | POA: Diagnosis not present

## 2018-03-05 DIAGNOSIS — J309 Allergic rhinitis, unspecified: Secondary | ICD-10-CM | POA: Diagnosis not present

## 2018-04-09 DIAGNOSIS — M810 Age-related osteoporosis without current pathological fracture: Secondary | ICD-10-CM | POA: Diagnosis not present

## 2018-04-09 DIAGNOSIS — I776 Arteritis, unspecified: Secondary | ICD-10-CM | POA: Diagnosis not present

## 2018-04-09 DIAGNOSIS — Z79899 Other long term (current) drug therapy: Secondary | ICD-10-CM | POA: Diagnosis not present

## 2018-04-09 DIAGNOSIS — M549 Dorsalgia, unspecified: Secondary | ICD-10-CM | POA: Diagnosis not present

## 2018-04-09 DIAGNOSIS — M79643 Pain in unspecified hand: Secondary | ICD-10-CM | POA: Diagnosis not present

## 2018-04-09 DIAGNOSIS — M05772 Rheumatoid arthritis with rheumatoid factor of left ankle and foot without organ or systems involvement: Secondary | ICD-10-CM | POA: Diagnosis not present

## 2018-04-09 DIAGNOSIS — M7989 Other specified soft tissue disorders: Secondary | ICD-10-CM | POA: Diagnosis not present

## 2018-04-09 DIAGNOSIS — M059 Rheumatoid arthritis with rheumatoid factor, unspecified: Secondary | ICD-10-CM | POA: Diagnosis not present

## 2018-05-11 DIAGNOSIS — M549 Dorsalgia, unspecified: Secondary | ICD-10-CM | POA: Diagnosis not present

## 2018-05-11 DIAGNOSIS — Z79899 Other long term (current) drug therapy: Secondary | ICD-10-CM | POA: Diagnosis not present

## 2018-05-11 DIAGNOSIS — M05772 Rheumatoid arthritis with rheumatoid factor of left ankle and foot without organ or systems involvement: Secondary | ICD-10-CM | POA: Diagnosis not present

## 2018-05-11 DIAGNOSIS — M79643 Pain in unspecified hand: Secondary | ICD-10-CM | POA: Diagnosis not present

## 2018-05-11 DIAGNOSIS — I776 Arteritis, unspecified: Secondary | ICD-10-CM | POA: Diagnosis not present

## 2018-05-11 DIAGNOSIS — M810 Age-related osteoporosis without current pathological fracture: Secondary | ICD-10-CM | POA: Diagnosis not present

## 2018-05-11 DIAGNOSIS — M7989 Other specified soft tissue disorders: Secondary | ICD-10-CM | POA: Diagnosis not present

## 2018-05-22 DIAGNOSIS — F410 Panic disorder [episodic paroxysmal anxiety] without agoraphobia: Secondary | ICD-10-CM | POA: Diagnosis not present

## 2018-05-22 DIAGNOSIS — E785 Hyperlipidemia, unspecified: Secondary | ICD-10-CM | POA: Diagnosis not present

## 2018-05-22 DIAGNOSIS — Z780 Asymptomatic menopausal state: Secondary | ICD-10-CM | POA: Diagnosis not present

## 2018-05-22 DIAGNOSIS — F411 Generalized anxiety disorder: Secondary | ICD-10-CM | POA: Diagnosis not present

## 2018-05-22 DIAGNOSIS — E1169 Type 2 diabetes mellitus with other specified complication: Secondary | ICD-10-CM | POA: Diagnosis not present

## 2018-05-22 DIAGNOSIS — L959 Vasculitis limited to the skin, unspecified: Secondary | ICD-10-CM | POA: Diagnosis not present

## 2018-05-22 DIAGNOSIS — I10 Essential (primary) hypertension: Secondary | ICD-10-CM | POA: Diagnosis not present

## 2018-05-28 DIAGNOSIS — E1169 Type 2 diabetes mellitus with other specified complication: Secondary | ICD-10-CM | POA: Diagnosis not present

## 2018-05-28 DIAGNOSIS — E785 Hyperlipidemia, unspecified: Secondary | ICD-10-CM | POA: Diagnosis not present

## 2018-05-28 DIAGNOSIS — I10 Essential (primary) hypertension: Secondary | ICD-10-CM | POA: Diagnosis not present

## 2018-06-11 ENCOUNTER — Other Ambulatory Visit: Payer: Self-pay

## 2018-06-27 DIAGNOSIS — L959 Vasculitis limited to the skin, unspecified: Secondary | ICD-10-CM | POA: Diagnosis not present

## 2018-06-27 DIAGNOSIS — M79643 Pain in unspecified hand: Secondary | ICD-10-CM | POA: Diagnosis not present

## 2018-06-27 DIAGNOSIS — M549 Dorsalgia, unspecified: Secondary | ICD-10-CM | POA: Diagnosis not present

## 2018-06-27 DIAGNOSIS — M810 Age-related osteoporosis without current pathological fracture: Secondary | ICD-10-CM | POA: Diagnosis not present

## 2018-06-27 DIAGNOSIS — Z79899 Other long term (current) drug therapy: Secondary | ICD-10-CM | POA: Diagnosis not present

## 2018-06-27 DIAGNOSIS — M7989 Other specified soft tissue disorders: Secondary | ICD-10-CM | POA: Diagnosis not present

## 2018-06-27 DIAGNOSIS — M05772 Rheumatoid arthritis with rheumatoid factor of left ankle and foot without organ or systems involvement: Secondary | ICD-10-CM | POA: Diagnosis not present

## 2018-07-12 ENCOUNTER — Other Ambulatory Visit (HOSPITAL_COMMUNITY): Payer: Self-pay | Admitting: Adult Health Nurse Practitioner

## 2018-07-12 DIAGNOSIS — Z1231 Encounter for screening mammogram for malignant neoplasm of breast: Secondary | ICD-10-CM

## 2018-07-21 IMAGING — MG 2D DIGITAL SCREENING BILATERAL MAMMOGRAM WITH 3D TOMO WITH CAD
6 of 9 series · 6 of 25 positions shown · non-contrast
Comparison: Previous exam(s).

CLINICAL DATA: Screening.

EXAM:
2D DIGITAL SCREENING BILATERAL MAMMOGRAM WITH 3D TOMO WITH CAD

[R MLO (1 of 2)]
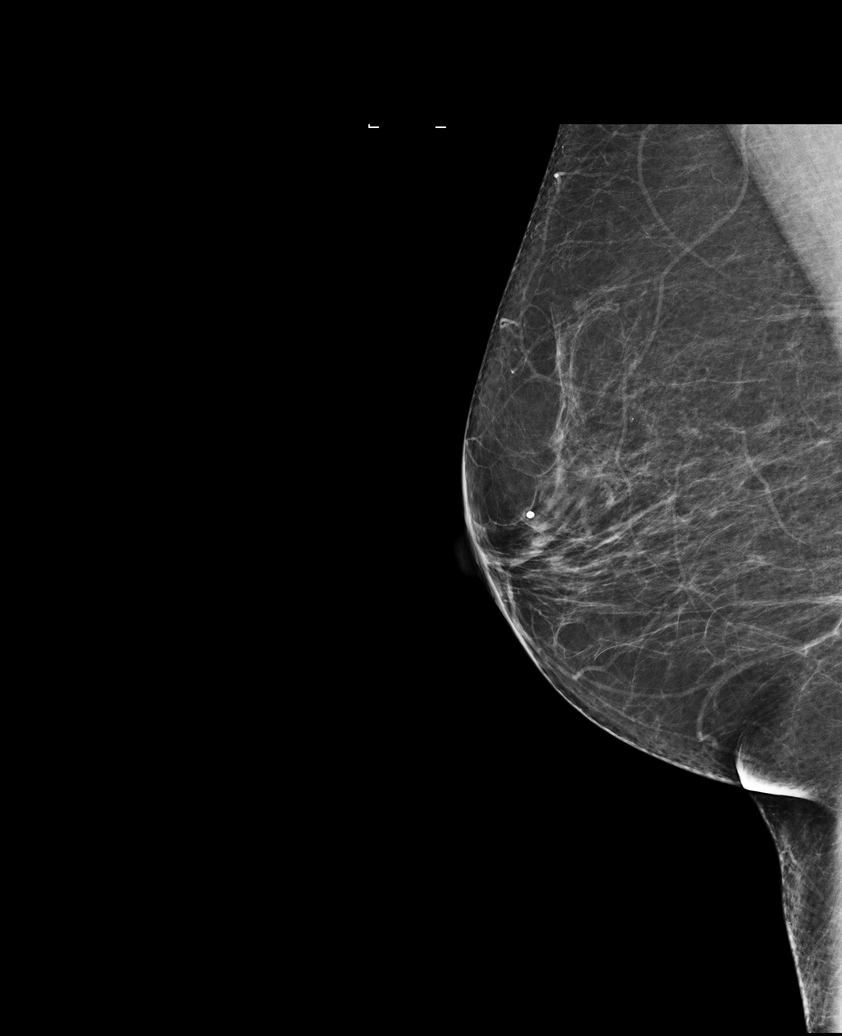

[R MLO (2 of 2)]
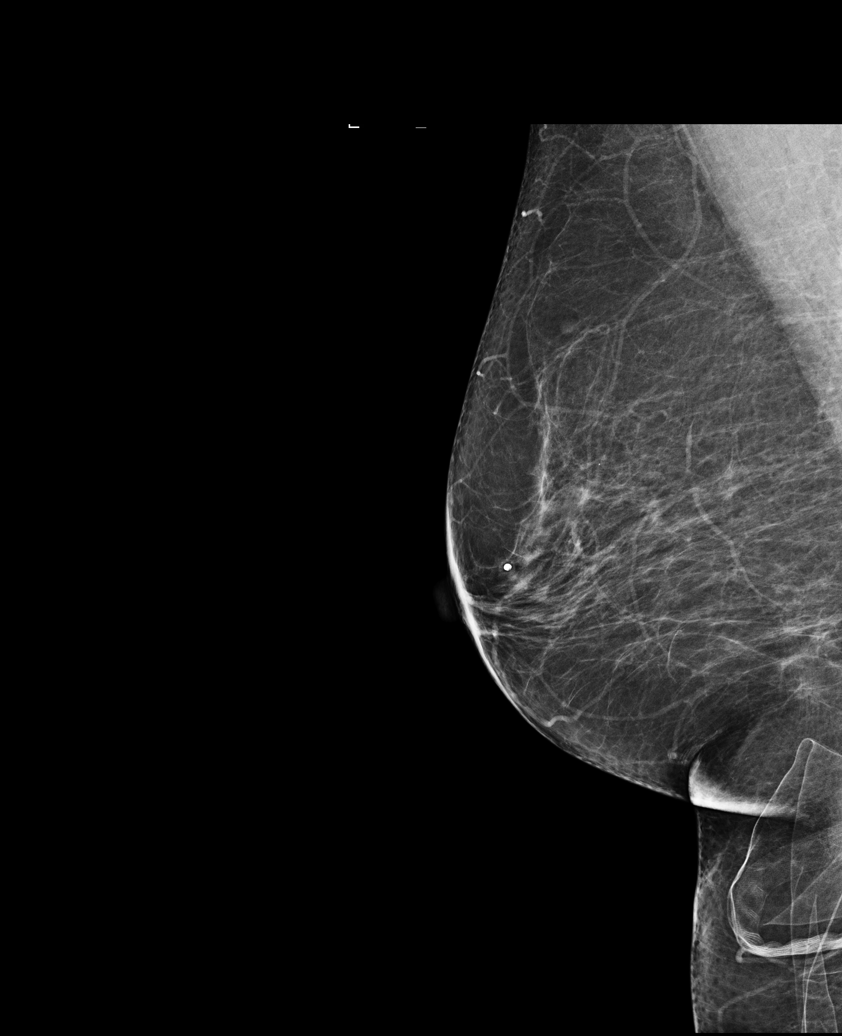

[L MLO]
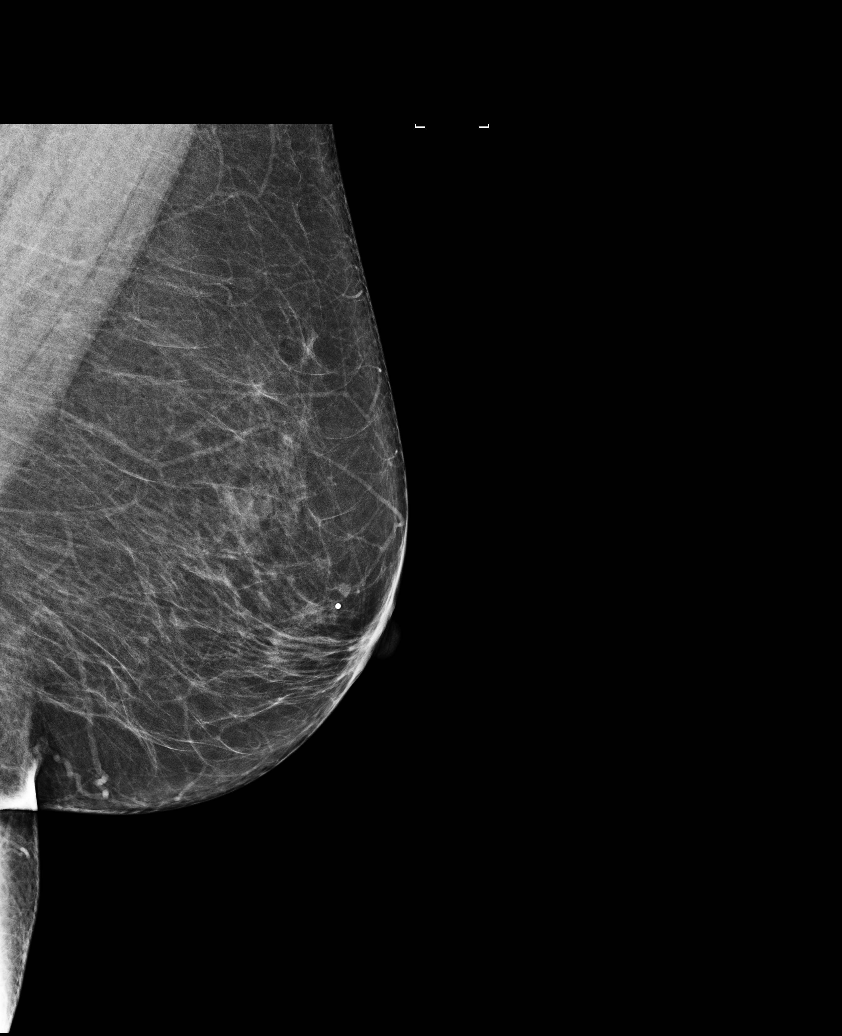

[L CC]
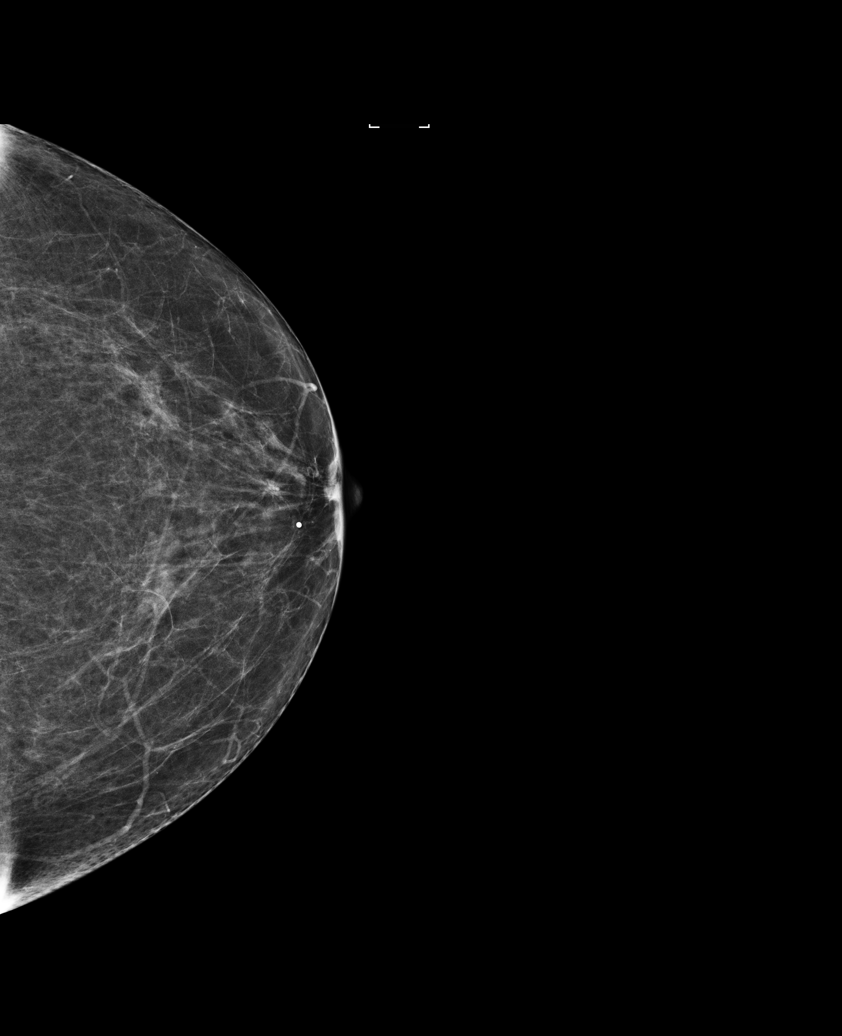

[R CC]
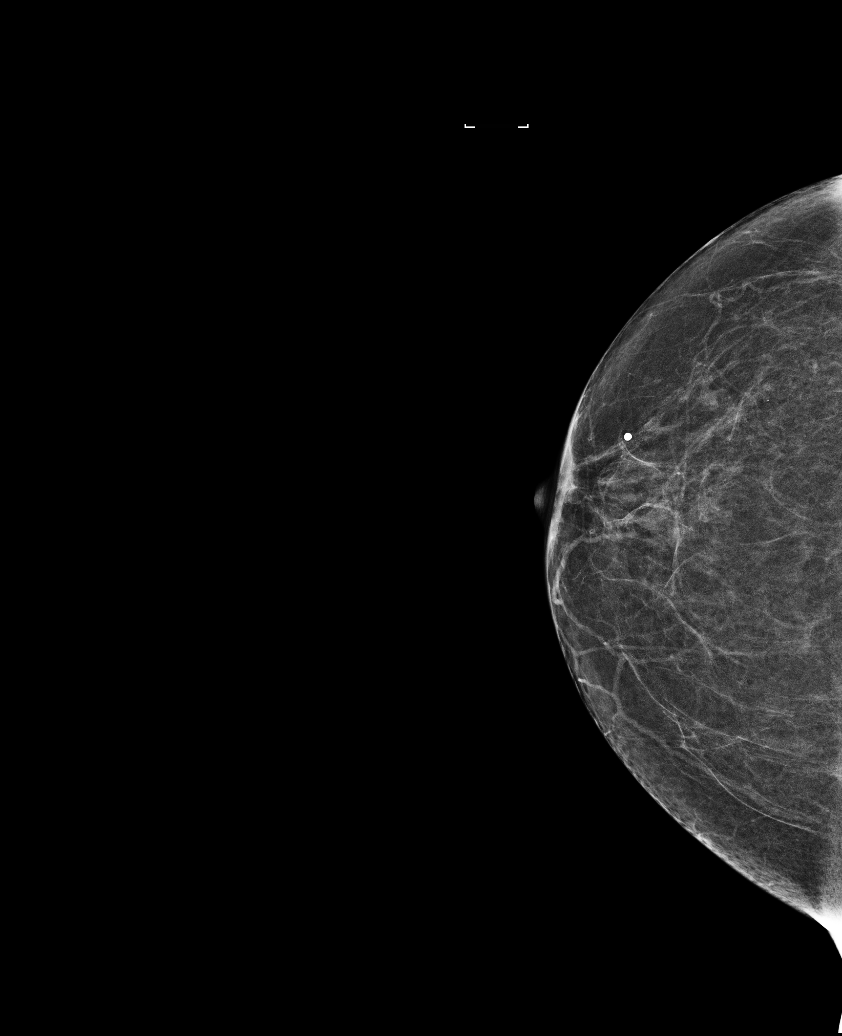

[L CC tomo · tomo slice 31/60.0]
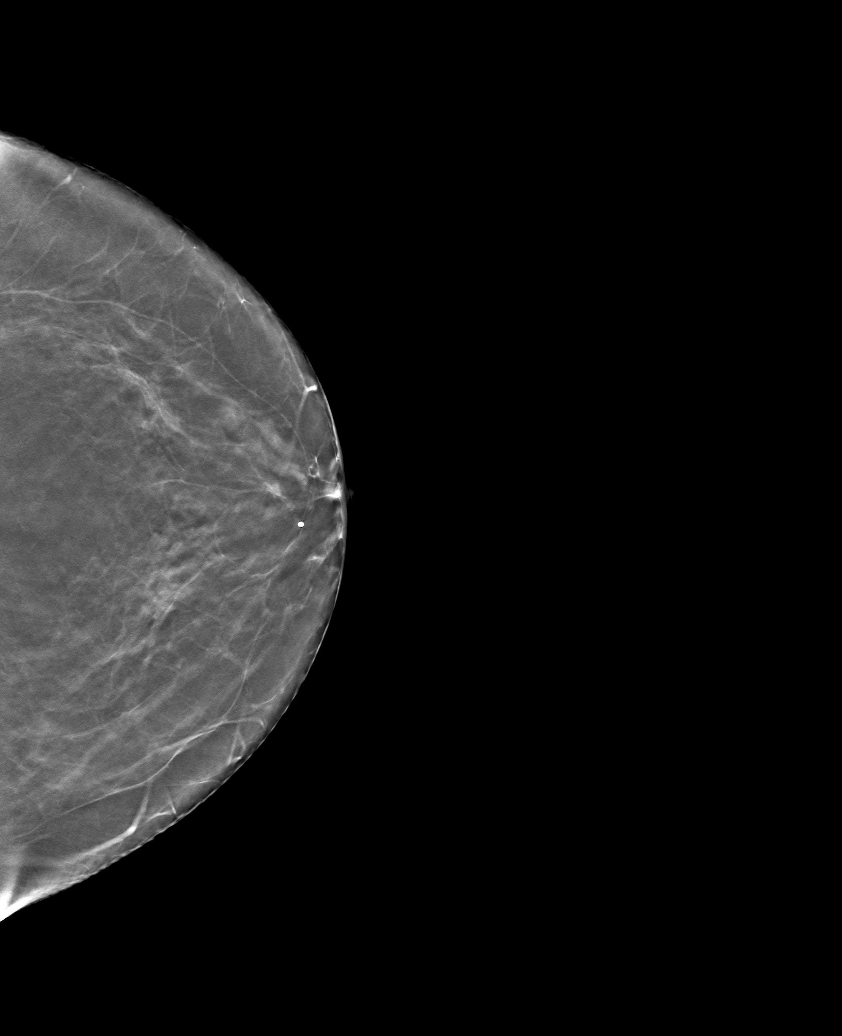

[6 of 25 positions shown; findings below may reference images not displayed]

ACR Breast Density Category b: There are scattered areas of
fibroglandular density.
FINDINGS: There are no findings suspicious for malignancy. Images were
processed with CAD.
IMPRESSION: No mammographic evidence of malignancy. A result letter of this
screening mammogram will be mailed directly to the patient.

RECOMMENDATION:
Screening mammogram in one year. (Code:[id number]-P-ZS0)

BI-RADS CATEGORY  1: Negative.

## 2018-08-20 ENCOUNTER — Ambulatory Visit (HOSPITAL_COMMUNITY)
Admission: RE | Admit: 2018-08-20 | Discharge: 2018-08-20 | Disposition: A | Discharge status: Home / Self Care | Payer: Medicare Other | Source: Ambulatory Visit | Attending: Adult Health Nurse Practitioner | Admitting: Adult Health Nurse Practitioner

## 2018-08-20 ENCOUNTER — Encounter (HOSPITAL_COMMUNITY): Payer: Self-pay

## 2018-08-20 DIAGNOSIS — Z1231 Encounter for screening mammogram for malignant neoplasm of breast: Secondary | ICD-10-CM | POA: Diagnosis not present

## 2019-07-03 ENCOUNTER — Other Ambulatory Visit (HOSPITAL_COMMUNITY): Payer: Self-pay | Admitting: Adult Health Nurse Practitioner

## 2019-07-03 DIAGNOSIS — Z1231 Encounter for screening mammogram for malignant neoplasm of breast: Secondary | ICD-10-CM

## 2019-08-23 ENCOUNTER — Ambulatory Visit (HOSPITAL_COMMUNITY)
Admission: RE | Admit: 2019-08-23 | Discharge: 2019-08-23 | Disposition: A | Discharge status: Home / Self Care | Payer: Medicare Other | Source: Ambulatory Visit | Attending: Adult Health Nurse Practitioner | Admitting: Adult Health Nurse Practitioner

## 2019-08-23 ENCOUNTER — Other Ambulatory Visit: Payer: Self-pay

## 2019-08-23 DIAGNOSIS — Z1231 Encounter for screening mammogram for malignant neoplasm of breast: Secondary | ICD-10-CM | POA: Diagnosis not present

## 2019-08-29 ENCOUNTER — Ambulatory Visit: Payer: Medicare Other | Attending: Internal Medicine

## 2019-08-29 DIAGNOSIS — Z23 Encounter for immunization: Secondary | ICD-10-CM

## 2019-08-29 NOTE — Progress Notes (Signed)
   Covid-19 Vaccination Clinic  Name:  Tammy Hudson    MRN: [id number] DOB: 1950/07/31  08/29/2019  Tammy Hudson was observed post Covid-19 immunization for 15 minutes without incidence. She was provided with Vaccine Information Sheet and instruction to access the V-Safe system.   Tammy Hudson was instructed to call 911 with any severe reactions post vaccine: Marland Kitchen Difficulty breathing  . Swelling of your face and throat  . A fast heartbeat  . A bad rash all over your body  . Dizziness and weakness    Immunizations Administered    Name Date Dose VIS Date Route   Moderna COVID-19 Vaccine 08/29/2019  1:19 PM 0.5 mL 06/25/2019 Intramuscular   Manufacturer: Levan Hurst   Lot: [id number]   NDCPO:[id number]     2

## 2019-09-30 ENCOUNTER — Ambulatory Visit: Payer: Medicare Other | Attending: Internal Medicine

## 2019-09-30 DIAGNOSIS — Z23 Encounter for immunization: Secondary | ICD-10-CM | POA: Insufficient documentation

## 2019-09-30 NOTE — Progress Notes (Signed)
- 

## 2020-07-16 ENCOUNTER — Other Ambulatory Visit (HOSPITAL_COMMUNITY): Payer: Self-pay | Admitting: Adult Health Nurse Practitioner

## 2020-07-16 DIAGNOSIS — Z1231 Encounter for screening mammogram for malignant neoplasm of breast: Secondary | ICD-10-CM

## 2020-08-27 ENCOUNTER — Ambulatory Visit (HOSPITAL_COMMUNITY)
Admission: RE | Admit: 2020-08-27 | Discharge: 2020-08-27 | Disposition: A | Discharge status: Home / Self Care | Payer: Medicare Other | Source: Ambulatory Visit | Attending: Adult Health Nurse Practitioner | Admitting: Adult Health Nurse Practitioner

## 2020-08-27 ENCOUNTER — Other Ambulatory Visit: Payer: Self-pay

## 2020-08-27 DIAGNOSIS — Z1231 Encounter for screening mammogram for malignant neoplasm of breast: Secondary | ICD-10-CM | POA: Diagnosis not present

## 2021-07-08 ENCOUNTER — Encounter (INDEPENDENT_AMBULATORY_CARE_PROVIDER_SITE_OTHER): Payer: Self-pay | Admitting: *Deleted

## 2021-08-02 ENCOUNTER — Other Ambulatory Visit (HOSPITAL_COMMUNITY): Payer: Self-pay | Admitting: Adult Health Nurse Practitioner

## 2021-08-02 DIAGNOSIS — Z1231 Encounter for screening mammogram for malignant neoplasm of breast: Secondary | ICD-10-CM

## 2021-08-30 ENCOUNTER — Ambulatory Visit (HOSPITAL_COMMUNITY)
Admission: RE | Admit: 2021-08-30 | Discharge: 2021-08-30 | Disposition: A | Discharge status: Home / Self Care | Payer: Medicare Other | Source: Ambulatory Visit | Attending: Adult Health Nurse Practitioner | Admitting: Adult Health Nurse Practitioner

## 2021-08-30 ENCOUNTER — Other Ambulatory Visit: Payer: Self-pay

## 2021-08-30 DIAGNOSIS — Z1231 Encounter for screening mammogram for malignant neoplasm of breast: Secondary | ICD-10-CM | POA: Insufficient documentation

## 2021-09-03 ENCOUNTER — Telehealth (INDEPENDENT_AMBULATORY_CARE_PROVIDER_SITE_OTHER): Payer: Self-pay | Admitting: *Deleted

## 2021-09-03 NOTE — Telephone Encounter (Signed)
LMOVM to call back to schedule TCS with Dr. Jenetta Downer, room1

## 2021-09-16 ENCOUNTER — Encounter (INDEPENDENT_AMBULATORY_CARE_PROVIDER_SITE_OTHER): Payer: Self-pay

## 2021-09-23 ENCOUNTER — Other Ambulatory Visit (INDEPENDENT_AMBULATORY_CARE_PROVIDER_SITE_OTHER): Payer: Self-pay

## 2021-09-23 DIAGNOSIS — Z1211 Encounter for screening for malignant neoplasm of colon: Secondary | ICD-10-CM

## 2021-09-28 ENCOUNTER — Telehealth (INDEPENDENT_AMBULATORY_CARE_PROVIDER_SITE_OTHER): Payer: Self-pay

## 2021-09-28 ENCOUNTER — Encounter (INDEPENDENT_AMBULATORY_CARE_PROVIDER_SITE_OTHER): Payer: Self-pay

## 2021-09-28 ENCOUNTER — Encounter (INDEPENDENT_AMBULATORY_CARE_PROVIDER_SITE_OTHER): Payer: Self-pay | Admitting: *Deleted

## 2021-09-28 MED ORDER — PEG 3350-KCL-NA BICARB-NACL 420 G PO SOLR: 4000.0000 mL | ORAL | 0 refills | Status: DC

## 2021-09-28 NOTE — Telephone Encounter (Signed)
Aeron Lheureux Ann Vernella Niznik CMA  

## 2021-09-28 NOTE — Telephone Encounter (Signed)
Ok to schedule.  Thanks,  Mylene Bow Castaneda Mayorga, MD Gastroenterology and Hepatology [facility]  

## 2021-09-28 NOTE — Telephone Encounter (Signed)
Referring MD/PCP: H[place] ? ?Procedure: Tcs ? ?Reason/Indication:  Screening ? ?Has patient had this procedure before?  yes ? If so, when, by whom and where?  7 yrs ago ? ?Is there a family history of colon cancer?  no ? Who?  What age when diagnosed?   ? ?Is patient diabetic? If yes, Type 1 or Type 2   yes, type 2 ?     ?Does patient have prosthetic heart valve or mechanical valve?  no ? ?Do you have a pacemaker/defibrillator?  no ? ?Has patient ever had endocarditis/atrial fibrillation? no ? ?Does patient use oxygen? no ? ?Has patient had joint replacement within last 12 months?  no ? ?Is patient constipated or do they take laxatives? no ? ?Does patient have a history of alcohol/drug use?  no ? ?Have you had a stroke/heart attack last 6 mths? no ? ?Do you take medicine for weight loss?  no ? ?For female patients,: have you had a hysterectomy no ?                     are you post menopausal yes ?                     do you still have your menstrual cycle no ? ?Is patient on blood thinner such as Coumadin, Plavix and/or Aspirin? no ? ?Medications: prednisone 5 mg daily, losartan 25 mg daily, duloxetine 30 mg daily, folic acid 1 mg daily, montelukast 10 mg daily, cyclobenzaprine 10 mg daily ? ?Allergies: nkda ? ?Medication Adjustment per Dr Jenetta Downer none ? ?Procedure date & time: 10/27/21 at 730 am ? ? ?

## 2021-10-27 ENCOUNTER — Ambulatory Visit (HOSPITAL_COMMUNITY): Admit: 2021-10-27 | Payer: Medicare Other | Admitting: Gastroenterology

## 2021-10-27 ENCOUNTER — Encounter (HOSPITAL_COMMUNITY): Payer: Self-pay

## 2021-10-27 SURGERY — COLONOSCOPY WITH PROPOFOL
Anesthesia: Monitor Anesthesia Care

## 2021-10-31 ENCOUNTER — Encounter (HOSPITAL_COMMUNITY): Payer: Self-pay

## 2021-10-31 ENCOUNTER — Emergency Department (HOSPITAL_COMMUNITY): Payer: Medicare Other

## 2021-10-31 ENCOUNTER — Other Ambulatory Visit: Payer: Self-pay

## 2021-10-31 ENCOUNTER — Emergency Department (HOSPITAL_COMMUNITY)
Admission: EM | Admit: 2021-10-31 | Discharge: 2021-10-31 | Disposition: A | Discharge status: Home / Self Care | Payer: Medicare Other | Attending: Emergency Medicine | Admitting: Emergency Medicine

## 2021-10-31 DIAGNOSIS — R0602 Shortness of breath: Secondary | ICD-10-CM | POA: Diagnosis present

## 2021-10-31 DIAGNOSIS — J441 Chronic obstructive pulmonary disease with (acute) exacerbation: Secondary | ICD-10-CM

## 2021-10-31 LAB — COMPREHENSIVE METABOLIC PANEL
ALT: 15 U/L (ref 0–44)
AST: 33 U/L (ref 15–41)
Albumin: 4.2 g/dL (ref 3.5–5.0)
Alkaline Phosphatase: 78 U/L (ref 38–126)
Anion gap: 9 (ref 5–15)
BUN: 10 mg/dL (ref 8–23)
CO2: 30 mmol/L (ref 22–32)
Calcium: 9.5 mg/dL (ref 8.9–10.3)
Chloride: 102 mmol/L (ref 98–111)
Creatinine, Ser: 1.11 mg/dL — ABNORMAL HIGH (ref 0.44–1.00)
GFR, Estimated: 53 mL/min — ABNORMAL LOW (ref >60)
Glucose, Bld: 109 mg/dL — ABNORMAL HIGH (ref 70–99)
Potassium: 4.3 mmol/L (ref 3.5–5.1)
Sodium: 141 mmol/L (ref 135–145)
Total Bilirubin: 0.9 mg/dL (ref 0.3–1.2)
Total Protein: 7.5 g/dL (ref 6.5–8.1)

## 2021-10-31 LAB — CBC WITH DIFFERENTIAL/PLATELET
Abs Immature Granulocytes: 0.02 10*3/uL (ref 0.00–0.07)
Basophils Absolute: 0 10*3/uL (ref 0.0–0.1)
Basophils Relative: 1 %
Eosinophils Absolute: 0.6 10*3/uL — ABNORMAL HIGH (ref 0.0–0.5)
Eosinophils Relative: 10 %
HCT: 41.9 % (ref 36.0–46.0)
Hemoglobin: 13.2 g/dL (ref 12.0–15.0)
Immature Granulocytes: 0 %
Lymphocytes Relative: 26 %
Lymphs Abs: 1.6 10*3/uL (ref 0.7–4.0)
MCH: 29.8 pg (ref 26.0–34.0)
MCHC: 31.5 g/dL (ref 30.0–36.0)
MCV: 94.6 fL (ref 80.0–100.0)
Monocytes Absolute: 0.6 10*3/uL (ref 0.1–1.0)
Monocytes Relative: 10 %
Neutro Abs: 3.3 10*3/uL (ref 1.7–7.7)
Neutrophils Relative %: 53 %
Platelets: 261 10*3/uL (ref 150–400)
RBC: 4.43 MIL/uL (ref 3.87–5.11)
RDW: 14.4 % (ref 11.5–15.5)
WBC: 6.1 10*3/uL (ref 4.0–10.5)
nRBC: 0 % (ref 0.0–0.2)

## 2021-10-31 MED ORDER — PREDNISONE 20 MG PO TABS: ORAL_TABLET | ORAL | 0 refills | Status: DC

## 2021-10-31 NOTE — ED Provider Notes (Signed)
?[facility] ?Provider Note ? ? ?CSN: [id number] ?Arrival date & time: 10/31/21  1445 ? ?  ? ?History ? ?Chief Complaint  ?Patient presents with  ? Cough  ? ? ?Tammy Hudson is a 71 y.o. female. ? ?Patient complains of shortness of breath for months.  She has been on prednisone before and has stopped it recently.  Patient has a history of bronchospasm.  Patient also has arthritic problems that she is on methotrexate ? ?The history is provided by the patient and medical records. No language interpreter was used.  ?Shortness of Breath ?Severity:  Mild ?Onset quality:  Sudden ?Timing:  Constant ?Progression:  Waxing and waning ?Chronicity:  Recurrent ?Context: activity   ?Relieved by:  Nothing ?Worsened by:  Nothing ?Ineffective treatments:  None tried ?Associated symptoms: no abdominal pain, no chest pain, no cough, no headaches and no rash   ?Risk factors: no recent alcohol use   ? ?  ? ?Home Medications ?Prior to Admission medications   ?Medication Sig Start Date End Date Taking? Authorizing Provider  ?predniSONE (DELTASONE) 20 MG tablet 2 tabs po daily x 3 days 10/31/21  Yes Milton Ferguson, MD  ?ALPRAZolam Duanne Moron) 0.25 MG tablet TAKE 1 TABLET BY MOUTH TWICE DAILY AS NEEDED ?Patient not taking: Reported on 10/21/2021 12/05/16   Timmothy Euler, MD  ?Cinnamon 500 MG capsule Take 500 mg by mouth daily.    [provider]  ?DULoxetine (CYMBALTA) 30 MG capsule Take 30 mg by mouth daily as needed (RA pain).    [provider]  ?escitalopram (LEXAPRO) 20 MG tablet Take 20 mg by mouth daily.    [provider]  ?fexofenadine (ALLEGRA) 180 MG tablet Take 180 mg by mouth daily as needed for allergies or rhinitis.    [provider]  ?folic acid (FOLVITE) 1 MG tablet Take 1 mg by mouth daily.    [provider]  ?losartan (COZAAR) 25 MG tablet Take 25 mg by mouth daily.    [provider]  ?methotrexate (RHEUMATREX) 2.5 MG tablet Take 15 mg by mouth every  Tuesday. 07/29/21   [provider]  ?montelukast (SINGULAIR) 10 MG tablet Take 10 mg by mouth at bedtime.    [provider]  ?polyethylene glycol-electrolytes (TRILYTE) 420 g solution Take 4,000 mLs by mouth as directed. 09/28/21   Harvel Quale, MD  ?simvastatin (ZOCOR) 20 MG tablet Take 20 mg by mouth at bedtime.    [provider]  ?zolpidem (AMBIEN) 10 MG tablet Take 10 mg by mouth at bedtime as needed for sleep.    [provider]  ?   ? ?Allergies    ?Patient has no known allergies.   ? ?Review of Systems   ?Review of Systems  ?Constitutional:  Negative for appetite change and fatigue.  ?HENT:  Negative for congestion, ear discharge and sinus pressure.   ?Eyes:  Negative for discharge.  ?Respiratory:  Positive for shortness of breath. Negative for cough.   ?Cardiovascular:  Negative for chest pain.  ?Gastrointestinal:  Negative for abdominal pain and diarrhea.  ?Genitourinary:  Negative for frequency and hematuria.  ?Musculoskeletal:  Negative for back pain.  ?Skin:  Negative for rash.  ?Neurological:  Negative for seizures and headaches.  ?Psychiatric/Behavioral:  Negative for hallucinations.   ? ?Physical Exam ?Updated Vital Signs ?BP (!) 156/82   Pulse (!) 106   Temp 98.5 ?F (36.9 ?C) (Oral)   Resp (!) 24   Ht '5\' 7"'$  (1.702 m)  Wt 90.7 kg   SpO2 94%   BMI 31.32 kg/m?  ?Physical Exam ?Vitals and nursing note reviewed.  ?Constitutional:   ?   Appearance: She is well-developed.  ?HENT:  ?   Head: Normocephalic.  ?   Nose: Nose normal.  ?Eyes:  ?   General: No scleral icterus. ?   Conjunctiva/sclera: Conjunctivae normal.  ?Neck:  ?   Thyroid: No thyromegaly.  ?Cardiovascular:  ?   Rate and Rhythm: Normal rate and regular rhythm.  ?   Heart sounds: No murmur heard. ?  No friction rub. No gallop.  ?Pulmonary:  ?   Breath sounds: No stridor. No wheezing or rales.  ?Chest:  ?   Chest wall: No tenderness.  ?Abdominal:  ?   General: There is no distension.  ?    Tenderness: There is no abdominal tenderness. There is no rebound.  ?Musculoskeletal:     ?   General: Normal range of motion.  ?   Cervical back: Neck supple.  ?Lymphadenopathy:  ?   Cervical: No cervical adenopathy.  ?Skin: ?   Findings: No erythema or rash.  ?Neurological:  ?   Mental Status: She is alert and oriented to person, place, and time.  ?   Motor: No abnormal muscle tone.  ?   Coordination: Coordination normal.  ?Psychiatric:     ?   Behavior: Behavior normal.  ? ? ?ED Results / Procedures / Treatments   ?Labs ?(all labs ordered are listed, but only abnormal results are displayed) ?Labs Reviewed  ?CBC WITH DIFFERENTIAL/PLATELET - Abnormal; Notable for the following components:  ?    Result Value  ? Eosinophils Absolute 0.6 (*)   ? All other components within normal limits  ?COMPREHENSIVE METABOLIC PANEL - Abnormal; Notable for the following components:  ? Glucose, Bld 109 (*)   ? Creatinine, Ser 1.11 (*)   ? GFR, Estimated 53 (*)   ? All other components within normal limits  ? ? ?EKG ?None ? ?Radiology ?DG Chest 2 View ? ?Result Date: 10/31/2021 ?CLINICAL DATA:  Shortness of breath.  Wheezing. EXAM: CHEST - 2 VIEW COMPARISON:  None. FINDINGS: Lungs are mildly hyperexpanded. Linear opacity in the left mid lung compatible with atelectasis or scarring. The lungs are clear without focal pneumonia, edema, pneumothorax or pleural effusion. The cardiopericardial silhouette is within normal limits for size. The visualized bony structures of the thorax are unremarkable. IMPRESSION: Hyperexpansion without acute cardiopulmonary findings. Electronically Signed   By: Misty Stanley M.D.   On: 10/31/2021 15:37   ? ?Procedures ?Procedures  ? ? ?Medications Ordered in ED ?Medications - No data to display ? ?ED Course/ Medical Decision Making/ A&P ?  ?                        ?Medical Decision Making ?Amount and/or Complexity of Data Reviewed ?Labs: ordered. ?Radiology: ordered. ? ?Risk ?Prescription drug  management. ? ?This patient presents to the ED for concern of shortness of breath, this involves an extensive number of treatment options, and is a complaint that carries with it a high risk of complications and morbidity.  The differential diagnosis includes pneumonia COPD ? ? ?Co morbidities that complicate the patient evaluation ? ?COPD ? ? ?Additional history obtained: ? ?Additional history obtained from patient ?External records from outside source obtained and reviewed including hospital record ? ? ?Lab Tests: ? ?I Ordered, and personally interpreted labs.  The pertinent results include: CBC and chemistries  which were unremarkable ? ? ?Imaging Studies ordered: ? ?I ordered imaging studies including chest x-ray ?I independently visualized and interpreted imaging which showed hyperexpanded lungs ?I agree with the radiologist interpretation ? ? ?Cardiac Monitoring: / EKG: ? ?The patient was maintained on a cardiac monitor.  I personally viewed and interpreted the cardiac monitored which showed an underlying rhythm of: Normal sinus rhythm ? ? ?Consultations Obtained: ? ?No consult ? ?Problem List / ED Course / Critical interventions / Medication management ? ?Shortness of ?None ?Reevaluation of the patient after these medicines showed that the patient stayed the same ?I have reviewed the patients home medicines and have made adjustments as needed ? ? ?Social Determinants of Health: ? ?None ? ? ?Test / Admission - Considered: ? ?None ? ?Chest x-ray unremarkable and labs unremarkable.  Patient will be placed on prednisone and referred to [facility] pulmonary ? ? ? ? ? ? ? ?Final Clinical Impression(s) / ED Diagnoses ?Final diagnoses:  ?None  ? ? ?Rx / DC Orders ?ED Discharge Orders   ? ?      Ordered  ?  predniSONE (DELTASONE) 20 MG tablet       ? 10/31/21 2055  ? ?  ?  ? ?  ? ? ?  ?Milton Ferguson, MD ?11/03/21 [phone] ? ?

## 2021-10-31 NOTE — ED Triage Notes (Signed)
Patient with cough, wheezing, and shortness of breath for several months. Has used inhaler without relief.  ?

## 2021-10-31 NOTE — Discharge Instructions (Signed)
Call make an appointment to follow-up with [facility] in [place] ?

## 2021-11-05 ENCOUNTER — Encounter: Payer: Self-pay | Admitting: Internal Medicine

## 2021-11-05 ENCOUNTER — Ambulatory Visit (INDEPENDENT_AMBULATORY_CARE_PROVIDER_SITE_OTHER): Payer: Medicare Other | Admitting: Internal Medicine

## 2021-11-05 ENCOUNTER — Telehealth: Payer: Self-pay | Admitting: Internal Medicine

## 2021-11-05 DIAGNOSIS — J45991 Cough variant asthma: Secondary | ICD-10-CM | POA: Diagnosis not present

## 2021-11-05 HISTORY — DX: Cough variant asthma: J45.991

## 2021-11-05 MED ORDER — BUDESONIDE-FORMOTEROL FUMARATE 80-4.5 MCG/ACT IN AERO: INHALATION_SPRAY | RESPIRATORY_TRACT | 12 refills | Status: DC

## 2021-11-05 MED ORDER — ADVAIR HFA 115-21 MCG/ACT IN AERO: 2.0000 | INHALATION_SPRAY | Freq: Two times a day (BID) | RESPIRATORY_TRACT | 11 refills | Status: AC

## 2021-11-05 NOTE — Progress Notes (Signed)
? ?Tammy Hudson, female    DOB: 1951/01/26, 71 y.o.   MRN: [id number] ? ? ?Brief patient profile:  ?21  yobf quit smoking 1996 with tendency to bad bronchitis needing breathing treatments says completely resolved w/in 6 m and dx RA around 2020 on daily prednisone since then referred to pulmonary clinic in [facility]  11/05/2021 by ER/[facility]  for cough x 3 months in setting of rhinitis x 2016 controlled  with allegra and addition of singulair by PCP at onset of cough but no improvement with either anihistamines or singulair ? ? ? ? ?History of Present Illness  ?11/05/2021  Pulmonary/ 1st office eval/ Melvyn Novas / [facility]  ?Chief Complaint  ?Patient presents with  ? Consult  ?  Ref for coughing and bronchitis.   ?Dyspnea:  much better p rx by ER = increase pred to 40 mg  from "2 mg daily"maint ?Cough: some p hs but much better also and non productive / was quite violent apparently with streaks of blood resolved now ?Sleep: 30 degrees with pillows, bed flat o/w overt hB ?SABA use:  ?Rhinitis worse at onset of cough  ? ?No obvious day to day or daytime variability or assoc excess/ purulent sputum or mucus plugs or hemoptysis or cp or chest tightness, subjective wheeze. ? ?Sleeping now as above  without nocturnal  or early am exacerbation  of respiratory  c/o's or need for noct saba. Also denies any obvious fluctuation of symptoms with weather or environmental changes or other aggravating or alleviating factors except as outlined above  ? ?No unusual exposure hx or h/o childhood pna/ asthma or knowledge of premature birth. ? ?Current Allergies, Complete Past Medical History, Past Surgical History, Family History, and Social History were reviewed in [employer] record. ? ?ROS  The following are not active complaints unless bolded ?Hoarseness, sore throat, dysphagia, dental problems, itching, sneezing,  nasal congestion or discharge of excess mucus or purulent secretions, ear ache,   fever, chills,  sweats, unintended wt loss or wt gain, classically pleuritic or exertional cp,  orthopnea pnd or arm/hand swelling  or leg swelling, presyncope, palpitations, abdominal pain, anorexia, nausea, vomiting, diarrhea  or change in bowel habits or change in bladder habits, change in stools or change in urine, dysuria, hematuria,  rash, arthralgias, visual complaints, headache, numbness, weakness or ataxia or problems with walking or coordination,  change in mood or  memory. ?      ?   ? ?Past Medical History:  ?Diagnosis Date  ? Allergy   ? Anxiety   ? Arthritis   ? Depression   ? Diabetes mellitus without complication ([place])   ? GERD (gastroesophageal reflux disease)   ? Hyperlipidemia   ? Hypertension   ? Neuromuscular disorder ([place])   ? ? ?Outpatient Medications Prior to Visit  ?Medication Sig Dispense Refill  ? ALPRAZolam (XANAX) 0.25 MG tablet TAKE 1 TABLET BY MOUTH TWICE DAILY AS NEEDED 60 tablet 2  ? Cinnamon 500 MG capsule Take 500 mg by mouth daily.    ? DULoxetine (CYMBALTA) 30 MG capsule Take 30 mg by mouth daily as needed (RA pain).    ? escitalopram (LEXAPRO) 20 MG tablet Take 20 mg by mouth daily.    ? fexofenadine (ALLEGRA) 180 MG tablet Take 180 mg by mouth daily as needed for allergies or rhinitis.    ? folic acid (FOLVITE) 1 MG tablet Take 1 mg by mouth daily.    ? losartan (COZAAR) 25 MG tablet  Take 25 mg by mouth daily.    ? methotrexate (RHEUMATREX) 2.5 MG tablet Take 15 mg by mouth every Tuesday.    ? montelukast (SINGULAIR) 10 MG tablet Take 10 mg by mouth at bedtime.    ? polyethylene glycol-electrolytes (TRILYTE) 420 g solution Take 4,000 mLs by mouth as directed. 4000 mL 0  ? predniSONE (DELTASONE) 20 MG tablet 2 tabs po daily x 3 days 6 tablet 0  ? simvastatin (ZOCOR) 20 MG tablet Take 20 mg by mouth at bedtime.    ? zolpidem (AMBIEN) 10 MG tablet Take 10 mg by mouth at bedtime as needed for sleep.    ? ?No facility-administered medications prior to visit.  ? ? ? ?Objective:  ?  ? ?BP (!) 142/98  (BP Location: Left Arm, Patient Position: Sitting)   Pulse 98   Temp 98.2 ?F (36.8 ?C) (Temporal)   Ht '5\' 7"'$  (1.702 m)   Wt 203 lb 9.6 oz (92.4 kg)   SpO2 98% Comment: ra  BMI 31.89 kg/m?  ? ?SpO2: 98 % (ra) ? ?Pleasant amb bf nad ? ? ? HEENT : nl turbinated, orophx   ? ? ?NECK :  without JVD/Nodes/TM/ nl carotid upstrokes bilaterally ? ? ?LUNGS: no acc muscle use,  Nl contour chest which is clear to A and P bilaterally without cough on insp or exp maneuvers ? ? ?CV:  RRR  no s3 or murmur or increase in P2, and no edema  ? ?ABD:  obese soft and nontender with nl inspiratory excursion in the supine position. No bruits or organomegaly appreciated, bowel sounds nl ? ?MS:  Nl gait/ ext warm without deformities, calf tenderness, cyanosis or clubbing ?No obvious joint restrictions  ? ?SKIN: warm and dry without lesions   ? ?NEURO:  alert, approp, nl sensorium with  no motor or cerebellar deficits apparent.  ? ? ?   ?I personally reviewed images and agree with radiology impression as follows:  ?CXR:   Pa and lateral   10/31/21  ?Hyperexpansion without acute cardiopulmonary findings. ?Assessment  ? ?No problem-specific Assessment & Plan notes found for this encounter. ? ? ? ? ?Christinia Gully, MD ?11/05/2021 ?   ?

## 2021-11-05 NOTE — Telephone Encounter (Signed)
Order sent in.  ?Called and notified patient.  ?Nothing further needed ?

## 2021-11-05 NOTE — Telephone Encounter (Signed)
Ok to use advarir 115  hfa  Take 2 puffs first thing in am and then another 2 puffs about 12 hours later.  ?  ?

## 2021-11-05 NOTE — Telephone Encounter (Signed)
Tammy Hudson states Symbicort is not covered by insurance. States Advair HFA and Advair Diskus is covered by insurance. Pharmacy is [facility]. [name] phone number is [phone].  ? ?Dr. Melvyn Novas please advise  ? ?

## 2021-11-05 NOTE — Patient Instructions (Addendum)
Symbicort 80  Take up to 2 puffs first thing in am and then another 2 puffs about 12 hours later.  ? ?Reduce prednisone to where it was before  ? ?As long as any cough day or night:  Try prilosec otc '20mg'$   Take 30-60 min before first meal of the day and Pepcid ac (famotidine) 20 mg after supper or an hour before bedtime until cough is completely gone for at least a week without the need for cough suppression ? ? ?GERD (REFLUX)  is an extremely common cause of respiratory symptoms just like yours , many times with no obvious heartburn at all.  ? ? It can be treated with medication, but also with lifestyle changes including elevation of the head of your bed (ideally with 6 -8inch blocks under the headboard of your bed),  Smoking cessation, avoidance of late meals, excessive alcohol, and avoid fatty foods, chocolate, peppermint, colas, red wine, and acidic juices such as orange juice.  ?NO MINT OR MENTHOL PRODUCTS SO NO COUGH DROPS  ?USE SUGARLESS CANDY INSTEAD (Jolley ranchers or Stover's or Life Savers) or even ice chips will also do - the key is to swallow to prevent all throat clearing. ?NO OIL BASED VITAMINS - use powdered substitutes.  Avoid fish oil when coughing.  ? ? ?Please schedule a follow up office visit in 6 weeks, call sooner if needed with meds /inhalers  ? ?  ? ?. ?  ?

## 2021-11-05 NOTE — Assessment & Plan Note (Addendum)
Onset was xmas 2022 on a background of ? Allergic rhinitis and improved on high dose prednisone ?- 11/05/2021  After extensive coaching inhaler device,  effectiveness = 50% > try advair 115 2bid since insurance won't cover symbicort ? ?ddx is between cough variant asthma  ( or even mild RA related bronchiolitis)  vs Upper airway cough syndrome (previously labeled PNDS),  is so named because it's frequently impossible to sort out how much is  CR/sinusitis with freq throat clearing (which can be related to primary GERD)   vs  causing  secondary (" extra esophageal")  GERD from wide swings in gastric pressure that occur with throat clearing, often  promoting self use of mint and menthol lozenges that reduce the lower esophageal sphincter tone and exacerbate the problem further in a cyclical fashion.  ? ?These are the same pts (now being labeled as having "irritable larynx syndrome" by some cough centers) who not infrequently have a history of having failed to tolerate ace inhibitors,  dry powder inhalers or biphosphonates or report having atypical/extraesophageal reflux symptoms that don't respond to standard doses of PPI  and are easily confused as having aecopd or asthma flares by even experienced allergists/ pulmonologists (myself included).  ? ?rec trial of max dose gerd rx and trial of intermediate dose LABA/ICS and regrouip in 6 weeks with all medications and consider PFTs at that time  ? ?Discussed in detail all the  indications, usual  risks and alternatives  relative to the benefits with patient who agrees to proceed with Rx as outlined.     ? ?>>> return in 6 weeks  with all meds in hand using a trust but verify approach to confirm accurate Medication  Reconciliation The principal here is that until we are certain that the  patients are doing what we've asked, it makes no sense to ask them to do more.  ? ?    ?  ? ?Each maintenance medication was reviewed in detail including emphasizing most importantly the  difference between maintenance and prns and under what circumstances the prns are to be triggered using an action plan format where appropriate. ? ?Total time for H and P, chart review, counseling, reviewing hfa device(s) and generating customized AVS unique to this office visit / same day charting = > 45 min  ?     ?

## 2021-12-02 ENCOUNTER — Telehealth: Payer: Self-pay | Admitting: Internal Medicine

## 2021-12-02 NOTE — Telephone Encounter (Signed)
Called patient but she did not answer. Left message for her to call back.  

## 2021-12-03 ENCOUNTER — Other Ambulatory Visit (INDEPENDENT_AMBULATORY_CARE_PROVIDER_SITE_OTHER): Payer: Self-pay

## 2021-12-03 ENCOUNTER — Telehealth (INDEPENDENT_AMBULATORY_CARE_PROVIDER_SITE_OTHER): Payer: Self-pay

## 2021-12-03 ENCOUNTER — Encounter (INDEPENDENT_AMBULATORY_CARE_PROVIDER_SITE_OTHER): Payer: Self-pay

## 2021-12-03 DIAGNOSIS — Z1211 Encounter for screening for malignant neoplasm of colon: Secondary | ICD-10-CM

## 2021-12-03 MED ORDER — PEG 3350-KCL-NA BICARB-NACL 420 G PO SOLR: 4000.0000 mL | ORAL | 0 refills | Status: DC

## 2021-12-03 NOTE — Telephone Encounter (Signed)
Latoshia Monrroy Ann Prentiss Hammett, CMA  ?

## 2021-12-06 ENCOUNTER — Encounter (INDEPENDENT_AMBULATORY_CARE_PROVIDER_SITE_OTHER): Payer: Self-pay

## 2021-12-21 ENCOUNTER — Ambulatory Visit (INDEPENDENT_AMBULATORY_CARE_PROVIDER_SITE_OTHER): Payer: Medicare Other | Admitting: Internal Medicine

## 2021-12-21 ENCOUNTER — Encounter: Payer: Self-pay | Admitting: Internal Medicine

## 2021-12-21 DIAGNOSIS — R0609 Other forms of dyspnea: Secondary | ICD-10-CM | POA: Diagnosis not present

## 2021-12-21 DIAGNOSIS — J45991 Cough variant asthma: Secondary | ICD-10-CM

## 2021-12-21 NOTE — Progress Notes (Signed)
Tammy Hudson, female    DOB: [birth year], 71 y.o.   MRN: [id number]   Brief patient profile:  70  yobf quit smoking 1996 with tendency to bad bronchitis needing breathing treatments says completely resolved w/in 6 m and dx RA around 2020 on daily prednisone since then referred to pulmonary clinic in [facility]  11/05/2021 by ER/[facility]  for cough x 3 months in setting of rhinitis x 2016 controlled  with allegra and addition of singulair by PCP at onset of cough but no improvement with either anihistamines or singulair  Covid  19 in Aug 2022  History of Present Illness  11/05/2021  Pulmonary/ 1st office eval/ Lavina Resor / [facility]  Chief Complaint  Patient presents with   Consult    Ref for coughing and bronchitis.   Dyspnea:  much better p rx by ER = increase pred to 40 mg  from "2 mg daily"maint Cough: some p hs but much better also and non productive / was quite violent apparently with streaks of blood resolved now Sleep: 30 degrees with pillows, bed flat o/w overt hB SABA use:  Rhinitis worse at onset of cough  Rec Symbicort 80  Take up to 2 puffs first thing in am and then another 2 puffs about 12 hours later.  Reduce prednisone to where it was before  As long as any cough day or night:  Try prilosec otc '20mg'$   Take 30-60 min before first meal of the day and Pepcid ac (famotidine) 20 mg after supper or an hour before bedtime until cough is completely gone for at least a week without the need for cough suppression GERD diet reviewed, bed blocks rec   Please schedule a follow up office visit in 6 weeks, call sooner if needed with meds Ned Clines      12/21/2021  f/u ov/[place] office/Rainer Mounce re: advair 115  maint on prednisone 5 mg   - did not bring all meds or any inhalers  Chief Complaint  Patient presents with   Follow-up    Cough has been on and off.  Can sometimes taste blood and has seen blood while coughing.   Dyspnea:  [employer] x 2 aisles all she can do  Cough: at hs and  then clears and sleeps  ok  and no flare in am > tastes blood but can't ever see it, mucus is always white / no epistaxis  Sleeping: flat bed 3 pillows  SABA use: twice a weekly seems to help cough / never rechallenges p exertion and turns out only uses advair p exertion provokes sob but feels it helps her recover within secs - never rechallenges  02: none  Covid status: vax x "all of em"     No obvious day to day or daytime variability or assoc excess/ purulent sputum or mucus plugs or h cp or chest tightness, subjective wheeze or overt sinus or hb symptoms.    Also denies any obvious fluctuation of symptoms with weather or environmental changes or other aggravating or alleviating factors except as outlined above   No unusual exposure hx or h/o childhood pna/ asthma or knowledge of premature birth.  Current Allergies, Complete Past Medical History, Past Surgical History, Family History, and Social History were reviewed in [employer] record.  ROS  The following are not active complaints unless bolded Hoarseness, sore throat, dysphagia, dental problems, itching, sneezing,  nasal congestion or discharge of excess mucus or purulent secretions, ear ache,   fever, chills,  sweats, unintended wt loss or wt gain, classically pleuritic or exertional cp,  orthopnea pnd or arm/hand swelling  or leg swelling better , presyncope, palpitations, abdominal pain, anorexia, nausea, vomiting, diarrhea  or change in bowel habits or change in bladder habits, change in stools or change in urine, dysuria, hematuria,  rash, arthralgias, visual complaints, headache, numbness, weakness or ataxia or problems with walking or coordination,  change in mood or  memory.        Current Meds  Medication Sig   ALPRAZolam (XANAX) 0.25 MG tablet TAKE 1 TABLET BY MOUTH TWICE DAILY AS NEEDED   Cinnamon 500 MG capsule Take 500 mg by mouth daily.   DULoxetine (CYMBALTA) 30 MG capsule Take 30 mg by mouth daily  as needed (RA pain).   escitalopram (LEXAPRO) 20 MG tablet Take 20 mg by mouth daily.   fexofenadine (ALLEGRA) 180 MG tablet Take 180 mg by mouth daily as needed for allergies or rhinitis.   fluticasone-salmeterol (ADVAIR HFA) 115-21 MCG/ACT inhaler Inhale 2 puffs into the lungs 2 (two) times daily.   folic acid (FOLVITE) 1 MG tablet Take 1 mg by mouth daily.   losartan (COZAAR) 25 MG tablet Take 25 mg by mouth daily.   methotrexate (RHEUMATREX) 2.5 MG tablet Take 15 mg by mouth every Tuesday.   montelukast (SINGULAIR) 10 MG tablet Take 10 mg by mouth at bedtime.   polyethylene glycol-electrolytes (TRILYTE) 420 g solution Take 4,000 mLs by mouth as directed.   predniSONE (DELTASONE) 20 MG tablet 2 tabs po daily x 3 days   simvastatin (ZOCOR) 20 MG tablet Take 20 mg by mouth at bedtime.   zolpidem (AMBIEN) 10 MG tablet Take 10 mg by mouth at bedtime as needed for sleep.                 Past Medical History:  Diagnosis Date   Allergy    Anxiety    Arthritis    Depression    Diabetes mellitus without complication ([facility])    GERD (gastroesophageal reflux disease)    Hyperlipidemia    Hypertension    Neuromuscular disorder ([place])        Objective:      Wt Readings from Last 3 Encounters:  12/21/21 203 lb (92.1 kg)  11/05/21 203 lb 9.6 oz (92.4 kg)  10/31/21 200 lb (90.7 kg)      Vital signs reviewed  12/21/2021  - Note at rest 02 sats  98% on RA   General appearance:    amb pleasant bf nad     HEENT : Oropharynx  clear / no excess secretions/ bloody or otherwise.  Nasal turbintes nl / no blood    NECK :  without  appent JVD/ palpable Nodes/TM    LUNGS: no acc muscle use,  Nl contour chest which is clear to A and P bilaterally without cough on insp or exp maneuvers   CV:  RRR  no s3 or murmur or increase in P2, and no edema   ABD:  soft and nontender with nl inspiratory excursion in the supine position. No bruits or organomegaly appreciated   MS:  Nl gait/ ext warm  without deformities Or obvious joint restrictions  calf tenderness, cyanosis or clubbing     SKIN: warm and dry without lesions    NEURO:  alert, approp, nl sensorium with  no motor or cerebellar deficits apparent.       Assessment

## 2021-12-21 NOTE — Assessment & Plan Note (Signed)
August  2022  Onset with covid / cough  - 12/21/2021   Walked on RA  x  3  lap(s) =  approx 450  ft  @ moderate pace, stopped due to end of study s sob  with lowest 02 sats 94%   Not able to reproduce doe "x 2 aisles at [employer]"  = 200 ft but clearly at risk of ILD related to RA, RA meds and prior covid with ongoing symptoms that transiently improved on prednisone, albeit a very non -specific finding   rec HRCT next step and f/u serial walking sats to monitor.  F/u ov in 4 weeks with all meds in hand using a trust but verify approach to confirm accurate Medication  Reconciliation The principal here is that until we are certain that the  patients are doing what we've asked, it makes no sense to ask them to do more.   Each maintenance medication was reviewed in detail including emphasizing most importantly the difference between maintenance and prns and under what circumstances the prns are to be triggered using an action plan format where appropriate.  Total time for H and P, chart review, counseling, reviewing hfa device(s) , directly observing portions of ambulatory 02 saturation study/ and generating customized AVS unique to this office visit / same day charting  > 30 min

## 2021-12-21 NOTE — Assessment & Plan Note (Addendum)
Onset was xmas 2022 on a background of ? Allergic rhinitis and improved on high dose prednisone - 11/05/2021  After extensive coaching inhaler device,  effectiveness = 50% > try advair 115 2bid since insurance won't cover symbicort  Not taking advair as rec so rechallenge with advair 115 to  Take 2 puffs first thing in am and then another 2 puffs about 12 hours later/ continue gerd rx for now and regroup in 4 weeks

## 2021-12-21 NOTE — Patient Instructions (Addendum)
Advair 115 should be Take 2 puffs first thing in am and then another 2 puffs about 12 hours later.   Work on inhaler technique:  relax and gently blow all the way out then take a nice smooth full deep breath back in, triggering the inhaler at same time you start breathing in.  Hold for up to 5 seconds if you can. Blow out thru nose. Rinse and gargle with water when done.  If mouth or throat bother you at all,  try brushing teeth/gums/tongue with arm and hammer toothpaste/ make a slurry and gargle and spit out.     Only use your albuterol as a rescue medication to be used if you can't catch your breath by resting or doing a relaxed purse lip breathing pattern.  - The less you use it, the better it will work when you need it. - Ok to use up to 2 puffs (or nebulizer)  every 4 hours if you must but call for immediate appointment if use goes up over your usual need - Don't leave home without it !!  (think of it like the spare tire for your car)   My office will be contacting you by phone for referral to CT chest at [facility]     Please schedule a follow up office visit in 4 weeks, sooner if needed - bring your inhalers/nebulizer solution "

## 2022-01-04 NOTE — Patient Instructions (Signed)
Teryl Gubler  01/04/2022     '@PREFPERIOPPHARMACY'$ @   Your procedure is scheduled on 01/11/2022.  Report to [facility] at 9:15 A.M.  Call this number if you have problems the morning of surgery:  [phone]   Remember:   Please follow the diet and prep instructions given to you by Dr Colman Cater office.     Take these medicines the morning of surgery with A SIP OF WATER : Xanax, Flexeril, Cymbalta, Lexapro, Allegra and Singulair  Do not take any diabetic medications the day of the procedure.    Do not wear jewelry, make-up or nail polish.  Do not wear lotions, powders, or perfumes, or deodorant.  Do not shave 48 hours prior to surgery.  Men may shave face and neck.  Do not bring valuables to the hospital.  [facility] is not responsible for any belongings or valuables.  Contacts, dentures or bridgework may not be worn into surgery.  Leave your suitcase in the car.  After surgery it may be brought to your room.  For patients admitted to the hospital, discharge time will be determined by your treatment team.  Patients discharged the day of surgery will not be allowed to drive home.   Name and phone number of your driver:   Family Special instructions:  N/A  Please read over the following fact sheets that you were given. Care and Recovery After Surgery    Colonoscopy, Adult A colonoscopy is a procedure to look at the entire large intestine. This procedure is done using a long, thin, flexible tube that has a camera on the end. You may have a colonoscopy: As a part of normal colorectal screening. If you have certain symptoms, such as: A low number of red blood cells in your blood (anemia). Diarrhea that does not go away. Pain in your abdomen. Blood in your stool. A colonoscopy can help screen for and diagnose medical problems, including: An abnormal growth of cells or tissue (tumor). Abnormal growths within the lining of your intestine (polyps). Inflammation. Areas of  bleeding. Tell your health care provider about: Any allergies you have. All medicines you are taking, including vitamins, herbs, eye drops, creams, and over-the-counter medicines. Any problems you or family members have had with anesthetic medicines. Any bleeding problems you have. Any surgeries you have had. Any medical conditions you have. Any problems you have had with having bowel movements. Whether you are pregnant or may be pregnant. What are the risks? Generally, this is a safe procedure. However, problems may occur, including: Bleeding. Damage to your intestine. Allergic reactions to medicines given during the procedure. Infection. This is rare. What happens before the procedure? Eating and drinking restrictions Follow instructions from your health care provider about eating or drinking restrictions, which may include: A few days before the procedure: Follow a low-fiber diet. Avoid nuts, seeds, dried fruit, raw fruits, and vegetables. 1-3 days before the procedure: Eat only gelatin dessert or ice pops. Drink only clear liquids, such as water, clear juice, clear broth or bouillon, black coffee or tea, or clear soft drinks or sports drinks. Avoid liquids that contain red or purple dye. The day of the procedure: Do not eat solid foods. You may continue to drink clear liquids until up to 2 hours before the procedure. Do not eat or drink anything starting 2 hours before the procedure, or within the time period that your health care provider recommends. Bowel prep If you were prescribed a bowel prep to take by  mouth (orally) to clean out your colon: Take it as told by your health care provider. Starting the day before your procedure, you will need to drink a large amount of liquid medicine. The liquid will cause you to have many bowel movements of loose stool until your stool becomes almost clear or light green. If your skin or the opening between the buttocks (anus) gets irritated  from diarrhea, you may relieve the irritation using: Wipes with medicine in them, such as adult wet wipes with aloe and vitamin E. A product to soothe skin, such as petroleum jelly. If you vomit while drinking the bowel prep: Take a break for up to 60 minutes. Begin the bowel prep again. Call your health care provider if you keep vomiting or you cannot take the bowel prep without vomiting. To clean out your colon, you may also be given: Laxative medicines. These help you have a bowel movement. Instructions for enema use. An enema is liquid medicine injected into your rectum. Medicines Ask your health care provider about: Changing or stopping your regular medicines or supplements. This is especially important if you are taking iron supplements, diabetes medicines, or blood thinners. Taking medicines such as aspirin and ibuprofen. These medicines can thin your blood. Do not take these medicines unless your health care provider tells you to take them. Taking over-the-counter medicines, vitamins, herbs, and supplements. General instructions Ask your health care provider what steps will be taken to help prevent infection. These may include washing skin with a germ-killing soap. If you will be going home right after the procedure, plan to have a responsible adult: Take you home from the hospital or clinic. You will not be allowed to drive. Care for you for the time you are told. What happens during the procedure?  An IV will be inserted into one of your veins. You will be given a medicine to make you fall asleep (general anesthetic). You will lie on your side with your knees bent. A lubricant will be put on the tube. Then the tube will be: Inserted into your anus. Gently eased through all parts of your large intestine. Air will be sent into your colon to keep it open. This may cause some pressure or cramping. Images will be taken with the camera and will appear on a screen. A small tissue  sample may be removed to be looked at under a microscope (biopsy). The tissue may be sent to a lab for testing if any signs of problems are found. If small polyps are found, they may be removed and checked for cancer cells. When the procedure is finished, the tube will be removed. The procedure may vary among health care providers and hospitals. What happens after the procedure? Your blood pressure, heart rate, breathing rate, and blood oxygen level will be monitored until you leave the hospital or clinic. You may have a small amount of blood in your stool. You may pass gas and have mild cramping or bloating in your abdomen. This is caused by the air that was used to open your colon during the exam. If you were given a sedative during the procedure, it can affect you for several hours. Do not drive or operate machinery until your health care provider says that it is safe. It is up to you to get the results of your procedure. Ask your health care provider, or the department that is doing the procedure, when your results will be ready. Summary A colonoscopy is a procedure to  look at the entire large intestine. Follow instructions from your health care provider about eating and drinking before the procedure. If you were prescribed an oral bowel prep to clean out your colon, take it as told by your health care provider. During the colonoscopy, a flexible tube with a camera on its end is inserted into the anus and then passed into all parts of the large intestine. This information is not intended to replace advice given to you by your health care provider. Make sure you discuss any questions you have with your health care provider. Document Revised: 07/05/2021 Document Reviewed: 03/03/2021 Elsevier Patient Education  [place] Anesthesia refers to techniques, procedures, and medicines that help a person stay safe and comfortable during a medical or dental  procedure. Monitored anesthesia care, or sedation, is one type of anesthesia. Your anesthesia specialist may recommend sedation if you will be having a procedure that does not require you to be unconscious. You may have this procedure for: Cataract surgery. A dental procedure. A biopsy. A colonoscopy. During the procedure, you may receive a medicine to help you relax (sedative). There are three levels of sedation: Mild sedation. At this level, you may feel awake and relaxed. You will be able to follow directions. Moderate sedation. At this level, you will be sleepy. You may not remember the procedure. Deep sedation. At this level, you will be asleep. You will not remember the procedure. The more medicine you are given, the deeper your level of sedation will be. Depending on how you respond to the procedure, the anesthesia specialist may change your level of sedation or the type of anesthesia to fit your needs. An anesthesia specialist will monitor you closely during the procedure. Tell a health care provider about: Any allergies you have. All medicines you are taking, including vitamins, herbs, eye drops, creams, and over-the-counter medicines. Any problems you or family members have had with anesthetic medicines. Any blood disorders you have. Any surgeries you have had. Any medical conditions you have, such as sleep apnea. Whether you are pregnant or may be pregnant. Whether you use cigarettes, alcohol, or drugs. Any use of steroids, whether by mouth or as a cream. What are the risks? Generally, this is a safe procedure. However, problems may occur, including: Getting too much medicine (oversedation). Nausea. Allergic reaction to medicines. Trouble breathing. If this happens, a breathing tube may be used to help with breathing. It will be removed when you are awake and breathing on your own. Heart trouble. Lung trouble. Confusion that gets better with time (emergence delirium). What  happens before the procedure? Staying hydrated Follow instructions from your health care provider about hydration, which may include: Up to 2 hours before the procedure - you may continue to drink clear liquids, such as water, clear fruit juice, black coffee, and plain tea. Eating and drinking restrictions Follow instructions from your health care provider about eating and drinking, which may include: 8 hours before the procedure - stop eating heavy meals or foods, such as meat, fried foods, or fatty foods. 6 hours before the procedure - stop eating light meals or foods, such as toast or cereal. 6 hours before the procedure - stop drinking milk or drinks that contain milk. 2 hours before the procedure - stop drinking clear liquids. Medicines Ask your health care provider about: Changing or stopping your regular medicines. This is especially important if you are taking diabetes medicines or blood thinners. Taking medicines such  as aspirin and ibuprofen. These medicines can thin your blood. Do not take these medicines unless your health care provider tells you to take them. Taking over-the-counter medicines, vitamins, herbs, and supplements. Tests and exams You will have a physical exam. You may have blood tests done to show: How well your kidneys and liver are working. How well your blood can clot. General instructions Plan to have a responsible adult take you home from the hospital or clinic. If you will be going home right after the procedure, plan to have a responsible adult care for you for the time you are told. This is important. What happens during the procedure?  Your blood pressure, heart rate, breathing, level of pain, and overall condition will be monitored. An IV will be inserted into one of your veins. You will be given medicines as needed to keep you comfortable during the procedure. This may mean changing the level of sedation. Depending on your age or the procedure, the  sedative may be given: As a pill that you will swallow or as a pill that is inserted into the rectum. As an injection into the vein or muscle. As a spray through the nose. The procedure will be performed. Your breathing, heart rate, and blood pressure will be monitored during the procedure. When the procedure is over, the medicine will be stopped. The procedure may vary among health care providers and hospitals. What happens after the procedure? Your blood pressure, heart rate, breathing rate, and blood oxygen level will be monitored until you leave the hospital or clinic. You may feel sleepy, clumsy, or nauseous. You may feel forgetful about what happened after the procedure. You may vomit. You may continue to get IV fluids. Do not drive or operate machinery until your health care provider says that it is safe. Summary Monitored anesthesia care is used to keep a patient comfortable during short procedures. Tell your health care provider about any allergies or health conditions you have and about all the medicines you are taking. Before the procedure, follow instructions about when to stop eating and drinking and about changing or stopping any medicines. Your blood pressure, heart rate, breathing rate, and blood oxygen level will be monitored until you leave the hospital or clinic. Plan to have a responsible adult take you home from the hospital or clinic. This information is not intended to replace advice given to you by your health care provider. Make sure you discuss any questions you have with your health care provider. Document Revised: 06/15/2021 Document Reviewed: 06/13/2019 Elsevier Patient Education  [place].

## 2022-01-06 ENCOUNTER — Encounter (HOSPITAL_COMMUNITY)
Admission: RE | Admit: 2022-01-06 | Discharge: 2022-01-06 | Disposition: A | Discharge status: Home / Self Care | Payer: Medicare Other | Source: Ambulatory Visit | Attending: Gastroenterology | Admitting: Gastroenterology

## 2022-01-06 ENCOUNTER — Encounter (HOSPITAL_COMMUNITY): Payer: Self-pay

## 2022-01-11 ENCOUNTER — Ambulatory Visit (HOSPITAL_COMMUNITY)
Admission: RE | Admit: 2022-01-11 | Discharge: 2022-01-11 | Disposition: A | Discharge status: Home / Self Care | Payer: Medicare Other | Attending: Gastroenterology | Admitting: Gastroenterology

## 2022-01-11 ENCOUNTER — Other Ambulatory Visit (HOSPITAL_COMMUNITY): Payer: Medicare Other

## 2022-01-11 ENCOUNTER — Encounter (HOSPITAL_COMMUNITY): Payer: Self-pay | Admitting: Gastroenterology

## 2022-01-11 ENCOUNTER — Ambulatory Visit (HOSPITAL_COMMUNITY): Payer: Medicare Other | Admitting: Anesthesiology

## 2022-01-11 ENCOUNTER — Encounter (HOSPITAL_COMMUNITY)
Admission: RE | Disposition: A | Discharge status: Home / Self Care | Payer: Self-pay | Source: Home / Self Care | Attending: Gastroenterology

## 2022-01-11 ENCOUNTER — Other Ambulatory Visit (INDEPENDENT_AMBULATORY_CARE_PROVIDER_SITE_OTHER): Payer: Self-pay

## 2022-01-11 ENCOUNTER — Telehealth (INDEPENDENT_AMBULATORY_CARE_PROVIDER_SITE_OTHER): Payer: Self-pay

## 2022-01-11 ENCOUNTER — Ambulatory Visit (HOSPITAL_BASED_OUTPATIENT_CLINIC_OR_DEPARTMENT_OTHER): Payer: Medicare Other | Admitting: Anesthesiology

## 2022-01-11 DIAGNOSIS — Z87891 Personal history of nicotine dependence: Secondary | ICD-10-CM | POA: Insufficient documentation

## 2022-01-11 DIAGNOSIS — F418 Other specified anxiety disorders: Secondary | ICD-10-CM | POA: Diagnosis not present

## 2022-01-11 DIAGNOSIS — E119 Type 2 diabetes mellitus without complications: Secondary | ICD-10-CM | POA: Insufficient documentation

## 2022-01-11 DIAGNOSIS — E1142 Type 2 diabetes mellitus with diabetic polyneuropathy: Secondary | ICD-10-CM

## 2022-01-11 DIAGNOSIS — M5442 Lumbago with sciatica, left side: Secondary | ICD-10-CM

## 2022-01-11 DIAGNOSIS — Z7984 Long term (current) use of oral hypoglycemic drugs: Secondary | ICD-10-CM | POA: Insufficient documentation

## 2022-01-11 DIAGNOSIS — F32A Depression, unspecified: Secondary | ICD-10-CM | POA: Insufficient documentation

## 2022-01-11 DIAGNOSIS — Z86010 Personal history of colonic polyps: Secondary | ICD-10-CM | POA: Insufficient documentation

## 2022-01-11 DIAGNOSIS — Z91199 Patient's noncompliance with other medical treatment and regimen due to unspecified reason: Secondary | ICD-10-CM

## 2022-01-11 DIAGNOSIS — J309 Allergic rhinitis, unspecified: Secondary | ICD-10-CM

## 2022-01-11 DIAGNOSIS — M1990 Unspecified osteoarthritis, unspecified site: Secondary | ICD-10-CM | POA: Insufficient documentation

## 2022-01-11 DIAGNOSIS — K219 Gastro-esophageal reflux disease without esophagitis: Secondary | ICD-10-CM | POA: Insufficient documentation

## 2022-01-11 DIAGNOSIS — E559 Vitamin D deficiency, unspecified: Secondary | ICD-10-CM

## 2022-01-11 DIAGNOSIS — I10 Essential (primary) hypertension: Secondary | ICD-10-CM | POA: Diagnosis not present

## 2022-01-11 DIAGNOSIS — J45991 Cough variant asthma: Secondary | ICD-10-CM

## 2022-01-11 DIAGNOSIS — R0609 Other forms of dyspnea: Secondary | ICD-10-CM

## 2022-01-11 DIAGNOSIS — E785 Hyperlipidemia, unspecified: Secondary | ICD-10-CM | POA: Diagnosis not present

## 2022-01-11 DIAGNOSIS — Z1211 Encounter for screening for malignant neoplasm of colon: Secondary | ICD-10-CM | POA: Insufficient documentation

## 2022-01-11 HISTORY — PX: FLEXIBLE SIGMOIDOSCOPY: SHX5431

## 2022-01-11 LAB — GLUCOSE, CAPILLARY: Glucose-Capillary: 130 mg/dL — ABNORMAL HIGH (ref 70–99)

## 2022-01-11 SURGERY — SIGMOIDOSCOPY, FLEXIBLE
Anesthesia: General

## 2022-01-11 MED ORDER — PROPOFOL 10 MG/ML IV BOLUS
INTRAVENOUS | Status: DC | PRN
  Administered 2022-01-11: 80 mg via INTRAVENOUS

## 2022-01-11 MED ORDER — PROPOFOL 500 MG/50ML IV EMUL
INTRAVENOUS | Status: DC | PRN
  Administered 2022-01-11: 200 ug/kg/min via INTRAVENOUS

## 2022-01-11 MED ORDER — PEG 3350-KCL-NA BICARB-NACL 420 G PO SOLR
4000.0000 mL | ORAL | 0 refills | Status: DC
Start: 2022-01-11 — End: 2022-04-11

## 2022-01-11 MED ORDER — LACTATED RINGERS IV SOLN: INTRAVENOUS | Status: DC

## 2022-01-11 MED ORDER — LIDOCAINE 2% (20 MG/ML) 5 ML SYRINGE
INTRAMUSCULAR | Status: DC | PRN
  Administered 2022-01-11: 50 mg via INTRAVENOUS

## 2022-01-11 NOTE — Transfer of Care (Signed)
Immediate Anesthesia Transfer of Care Note  Patient: Tammy Hudson  Procedure(s) Performed: FLEXIBLE SIGMOIDOSCOPY  Patient Location: Short Stay  Anesthesia Type:MAC  Level of Consciousness: sedated and patient cooperative  Airway & Oxygen Therapy: Patient Spontanous Breathing and Patient connected to nasal cannula oxygen  Post-op Assessment: Report given to RN, Post -op Vital signs reviewed and stable and Patient moving all extremities  Post vital signs: Reviewed and stable  Last Vitals:  Vitals Value Taken Time  BP    Temp    Pulse    Resp    SpO2      Last Pain:  Vitals:   01/11/22 1142  TempSrc:   PainSc: 0-No pain         Complications: No notable events documented.

## 2022-01-11 NOTE — Op Note (Signed)
[facility] Patient Name: Tammy Hudson Procedure Date: 01/11/2022 11:29 AM MRN: [id number] Date of Birth: 07/15/1951 Attending MD: Maylon Peppers ,  CSN: [id number] Age: 71 Admit Type: Outpatient Procedure:                Flexible Sigmoidoscopy Indications:              Screening for malignant neoplasm in the colon Providers:                Maylon Peppers, Lambert Mody, Everardo Pacific, Randa Spike, Technician Referring MD:              Medicines:                Monitored Anesthesia Care Complications:            No immediate complications. Estimated Blood Loss:     Estimated blood loss: none. Procedure:                Pre-Anesthesia Assessment:                           - Prior to the procedure, a History and Physical                            was performed, and patient medications, allergies                            and sensitivities were reviewed. The patient's                            tolerance of previous anesthesia was reviewed.                           - The risks and benefits of the procedure and the                            sedation options and risks were discussed with the                            patient. All questions were answered and informed                            consent was obtained.                           - ASA Grade Assessment: II - A patient with mild                            systemic disease.                           After obtaining informed consent, the scope was                            passed under direct vision. The [id number]-HQ190L                            (  [id number]) scope was introduced through the anus and                            advanced to the 60 cm from the anal verge. After                            obtaining informed consent, the scope was passed                            under direct vision.The flexible sigmoidoscopy was                            accomplished without difficulty. The  patient                            tolerated the procedure well. Scope In: 11:47:11 AM Scope Out: 11:52:06 AM Total Procedure Duration: 0 hours 4 minutes 55 seconds  Findings:      A moderate amount of solid stool was found in the transverse colon and       at the hepatic flexure, interfering with visualization. Impression:               - Stool in the transverse colon and at the hepatic                            flexure.                           - No specimens collected. Moderate Sedation:      Per Anesthesia Care Recommendation:           - Discharge patient to home (ambulatory).                           - Clear liquid diet today.                           - Will discuss rescheduling colonoscopy tomorrow to                            achieve adequate preparation. Procedure Code(s):        --- Professional ---                           W5462, Colorectal cancer screening; flexible                            sigmoidoscopy Diagnosis Code(s):        --- Professional ---                           Z12.11, Encounter for screening for malignant                            neoplasm of colon CPT copyright 2019 American Medical Association. All rights reserved. The codes documented in this report are preliminary and upon coder review may  be revised  to meet current compliance requirements. Maylon Peppers, MD Maylon Peppers,  01/11/2022 12:02:47 PM This report has been signed electronically. Number of Addenda: 0

## 2022-01-11 NOTE — Anesthesia Postprocedure Evaluation (Signed)
Anesthesia Post Note  Patient: Tammy Hudson  Procedure(s) Performed: [place]  Patient location during evaluation: Phase II Anesthesia Type: General Level of consciousness: awake and alert and oriented Pain management: pain level controlled Vital Signs Assessment: post-procedure vital signs reviewed and stable Respiratory status: spontaneous breathing, nonlabored ventilation and respiratory function stable Cardiovascular status: blood pressure returned to baseline and stable Postop Assessment: no apparent nausea or vomiting Anesthetic complications: no   No notable events documented.   Last Vitals:  Vitals:   01/11/22 0942 01/11/22 1159  BP: (!) 170/91 138/63  Pulse: 78 72  Resp: 15 14  Temp: 36.7 C 36.8 C  SpO2: 98% 98%    Last Pain:  Vitals:   01/11/22 1159  TempSrc: Oral  PainSc: 0-No pain                 Oberia Beaudoin C Ceyda Peterka

## 2022-01-11 NOTE — Telephone Encounter (Signed)
Jonell Brumbaugh Ann Aidon Klemens, CMA  ?

## 2022-01-11 NOTE — H&P (Signed)
Tammy Hudson is an 71 y.o. female.   Chief Complaint: Screening colonoscopy HPI: 71 year old female with medical history of depression, diabetes, GERD, hyperlipidemia, hypertension, coming for screening colonoscopy.  Last colonoscopy was performed in 2014, had 1 hyperplastic polyp removed from the colon.  The patient denies having any complaints such as melena, hematochezia, abdominal pain or distention, change in her bowel movement consistency or frequency, no changes in her weight recently.  No family history of colorectal cancer.   Past Medical History:  Diagnosis Date   Allergy    Anxiety    Arthritis    Cough variant asthma 11/05/2021   Depression    Diabetes mellitus without complication (HCC)    GERD (gastroesophageal reflux disease)    Hyperlipidemia    Hypertension    Neuromuscular disorder ([place])     Past Surgical History:  Procedure Laterality Date   ABDOMINAL HYSTERECTOMY      History reviewed. No pertinent family history. Social History:  reports that she quit smoking about 26 years ago. Her smoking use included cigarettes. She has never used smokeless tobacco. She reports that she does not drink alcohol and does not use drugs.  Allergies: No Known Allergies  Medications Prior to Admission  Medication Sig Dispense Refill   ALPRAZolam (XANAX) 0.25 MG tablet TAKE 1 TABLET BY MOUTH TWICE DAILY AS NEEDED 60 tablet 2   benzonatate (TESSALON) 100 MG capsule Take 100 mg by mouth 3 (three) times daily as needed for cough.     Cinnamon 500 MG capsule Take 500 mg by mouth daily.     cyclobenzaprine (FLEXERIL) 10 MG tablet Take 10 mg by mouth daily as needed for muscle spasms.     DULoxetine (CYMBALTA) 30 MG capsule Take 30 mg by mouth daily as needed (RA pain).     escitalopram (LEXAPRO) 20 MG tablet Take 20 mg by mouth daily as needed (depression).     fexofenadine (ALLEGRA) 180 MG tablet Take 180 mg by mouth daily.     fluticasone-salmeterol (ADVAIR HFA) 115-21 MCG/ACT  inhaler Inhale 2 puffs into the lungs 2 (two) times daily. 12 g 11   folic acid (FOLVITE) 341 MCG tablet Take 800 mcg by mouth daily.     losartan (COZAAR) 50 MG tablet Take 50 mg by mouth daily.     methotrexate (RHEUMATREX) 2.5 MG tablet Take 15 mg by mouth every Tuesday.     montelukast (SINGULAIR) 10 MG tablet Take 10 mg by mouth at bedtime.     ORENCIA CLICKJECT 962 MG/ML SOAJ Inject 1 Syringe into the skin every Wednesday.     polyethylene glycol-electrolytes (TRILYTE) 420 g solution Take 4,000 mLs by mouth as directed. 4000 mL 0   predniSONE (DELTASONE) 5 MG tablet Take 5 mg by mouth daily.     simvastatin (ZOCOR) 20 MG tablet Take 20 mg by mouth at bedtime.     zolpidem (AMBIEN) 10 MG tablet Take 10 mg by mouth at bedtime as needed for sleep.     albuterol (PROVENTIL) (2.5 MG/3ML) 0.083% nebulizer solution Inhale 3 mLs into the lungs every 4 (four) hours as needed for shortness of breath.     predniSONE (DELTASONE) 20 MG tablet 2 tabs po daily x 3 days (Patient not taking: Reported on 12/30/2021) 6 tablet 0    Results for orders placed or performed during the hospital encounter of 01/11/22 (from the past 48 hour(s))  Glucose, capillary     Status: Abnormal   Collection Time: 01/11/22 10:02 AM  Result Value Ref Range   Glucose-Capillary 130 (H) 70 - 99 mg/dL    Comment: Glucose reference range applies only to samples taken after fasting for at least 8 hours.   No results found.  Review of Systems  Constitutional: Negative.   HENT: Negative.    Eyes: Negative.   Respiratory: Negative.    Cardiovascular: Negative.   Gastrointestinal: Negative.   Endocrine: Negative.   Genitourinary: Negative.   Musculoskeletal: Negative.   Skin: Negative.   Allergic/Immunologic: Negative.   Neurological: Negative.   Hematological: Negative.   Psychiatric/Behavioral: Negative.      Blood pressure (!) 170/91, pulse 78, temperature 98 F (36.7 C), temperature source Oral, resp. rate 15,  height '5\' 7"'$  (1.702 m), weight 90.3 kg, SpO2 98 %. Physical Exam  GENERAL: The patient is AO x3, in no acute distress. HEENT: Head is normocephalic and atraumatic. EOMI are intact. Mouth is well hydrated and without lesions. NECK: Supple. No masses LUNGS: Clear to auscultation. No presence of rhonchi/wheezing/rales. Adequate chest expansion HEART: RRR, normal s1 and s2. ABDOMEN: Soft, nontender, no guarding, no peritoneal signs, and nondistended. BS +. No masses. EXTREMITIES: Without any cyanosis, clubbing, rash, lesions or edema. NEUROLOGIC: AOx3, no focal motor deficit. SKIN: no jaundice, no rashes  Assessment/Plan  71 year old female with medical history of depression, diabetes, GERD, hyperlipidemia, hypertension, coming for screening colonoscopy.The patient is at average risk for colorectal cancer.  We will proceed with colonoscopy today.   Harvel Quale, MD 01/11/2022, 10:58 AM

## 2022-01-11 NOTE — Anesthesia Preprocedure Evaluation (Signed)
Anesthesia Evaluation  Patient identified by MRN, date of birth, ID band Patient awake    Reviewed: Allergy & Precautions, NPO status , Patient's Chart, lab work & pertinent test results  Airway Mallampati: III  TM Distance: >3 FB Neck ROM: Full    Dental  (+) Dental Advisory Given, Upper Dentures, Lower Dentures   Pulmonary shortness of breath and with exertion, asthma , former smoker,    Pulmonary exam normal breath sounds clear to auscultation       Cardiovascular hypertension, Pt. on medications + DOE  Normal cardiovascular exam Rhythm:Regular Rate:Normal     Neuro/Psych PSYCHIATRIC DISORDERS Anxiety Depression  Neuromuscular disease    GI/Hepatic Neg liver ROS, GERD  Medicated,  Endo/Other  diabetes, Well Controlled, Type 2, Oral Hypoglycemic Agents  Renal/GU negative Renal ROS  negative genitourinary   Musculoskeletal  (+) Arthritis , Osteoarthritis,    Abdominal   Peds negative pediatric ROS (+)  Hematology negative hematology ROS (+)   Anesthesia Other Findings Left sciatica Snoring   Reproductive/Obstetrics negative OB ROS                           Anesthesia Physical Anesthesia Plan  ASA: 2  Anesthesia Plan: General   Post-op Pain Management: Minimal or no pain anticipated   Induction: Intravenous  PONV Risk Score and Plan: Propofol infusion  Airway Management Planned: Nasal Cannula and Natural Airway  Additional Equipment:   Intra-op Plan:   Post-operative Plan:   Informed Consent: I have reviewed the patients History and Physical, chart, labs and discussed the procedure including the risks, benefits and alternatives for the proposed anesthesia with the patient or authorized representative who has indicated his/her understanding and acceptance.     Dental advisory given  Plan Discussed with: CRNA and Surgeon  Anesthesia Plan Comments:          Anesthesia Quick Evaluation

## 2022-01-11 NOTE — Discharge Instructions (Signed)
You are being discharged to home.  Take a clear liquid diet today.  Will discuss rescheduling colonoscopy tomorrow to achieve adequate preparation.

## 2022-01-11 NOTE — H&P (View-Only) (Signed)
Tammy Hudson is an 71 y.o. female.   Chief Complaint: Screening colonoscopy HPI: 71 year old female with medical history of depression, diabetes, GERD, hyperlipidemia, hypertension, coming for screening colonoscopy.  Last colonoscopy was performed in 2014, had 1 hyperplastic polyp removed from the colon.  The patient denies having any complaints such as melena, hematochezia, abdominal pain or distention, change in her bowel movement consistency or frequency, no changes in her weight recently.  No family history of colorectal cancer.   Past Medical History:  Diagnosis Date   Allergy    Anxiety    Arthritis    Cough variant asthma 11/05/2021   Depression    Diabetes mellitus without complication (HCC)    GERD (gastroesophageal reflux disease)    Hyperlipidemia    Hypertension    Neuromuscular disorder ([place])     Past Surgical History:  Procedure Laterality Date   ABDOMINAL HYSTERECTOMY      History reviewed. No pertinent family history. Social History:  reports that she quit smoking about 26 years ago. Her smoking use included cigarettes. She has never used smokeless tobacco. She reports that she does not drink alcohol and does not use drugs.  Allergies: No Known Allergies  Medications Prior to Admission  Medication Sig Dispense Refill   ALPRAZolam (XANAX) 0.25 MG tablet TAKE 1 TABLET BY MOUTH TWICE DAILY AS NEEDED 60 tablet 2   benzonatate (TESSALON) 100 MG capsule Take 100 mg by mouth 3 (three) times daily as needed for cough.     Cinnamon 500 MG capsule Take 500 mg by mouth daily.     cyclobenzaprine (FLEXERIL) 10 MG tablet Take 10 mg by mouth daily as needed for muscle spasms.     DULoxetine (CYMBALTA) 30 MG capsule Take 30 mg by mouth daily as needed (RA pain).     escitalopram (LEXAPRO) 20 MG tablet Take 20 mg by mouth daily as needed (depression).     fexofenadine (ALLEGRA) 180 MG tablet Take 180 mg by mouth daily.     fluticasone-salmeterol (ADVAIR HFA) 115-21 MCG/ACT  inhaler Inhale 2 puffs into the lungs 2 (two) times daily. 12 g 11   folic acid (FOLVITE) 476 MCG tablet Take 800 mcg by mouth daily.     losartan (COZAAR) 50 MG tablet Take 50 mg by mouth daily.     methotrexate (RHEUMATREX) 2.5 MG tablet Take 15 mg by mouth every Tuesday.     montelukast (SINGULAIR) 10 MG tablet Take 10 mg by mouth at bedtime.     ORENCIA CLICKJECT 546 MG/ML SOAJ Inject 1 Syringe into the skin every Wednesday.     polyethylene glycol-electrolytes (TRILYTE) 420 g solution Take 4,000 mLs by mouth as directed. 4000 mL 0   predniSONE (DELTASONE) 5 MG tablet Take 5 mg by mouth daily.     simvastatin (ZOCOR) 20 MG tablet Take 20 mg by mouth at bedtime.     zolpidem (AMBIEN) 10 MG tablet Take 10 mg by mouth at bedtime as needed for sleep.     albuterol (PROVENTIL) (2.5 MG/3ML) 0.083% nebulizer solution Inhale 3 mLs into the lungs every 4 (four) hours as needed for shortness of breath.     predniSONE (DELTASONE) 20 MG tablet 2 tabs po daily x 3 days (Patient not taking: Reported on 12/30/2021) 6 tablet 0    Results for orders placed or performed during the hospital encounter of 01/11/22 (from the past 48 hour(s))  Glucose, capillary     Status: Abnormal   Collection Time: 01/11/22 10:02 AM  Result Value Ref Range   Glucose-Capillary 130 (H) 70 - 99 mg/dL    Comment: Glucose reference range applies only to samples taken after fasting for at least 8 hours.   No results found.  Review of Systems  Constitutional: Negative.   HENT: Negative.    Eyes: Negative.   Respiratory: Negative.    Cardiovascular: Negative.   Gastrointestinal: Negative.   Endocrine: Negative.   Genitourinary: Negative.   Musculoskeletal: Negative.   Skin: Negative.   Allergic/Immunologic: Negative.   Neurological: Negative.   Hematological: Negative.   Psychiatric/Behavioral: Negative.      Blood pressure (!) 170/91, pulse 78, temperature 98 F (36.7 C), temperature source Oral, resp. rate 15,  height '5\' 7"'$  (1.702 m), weight 90.3 kg, SpO2 98 %. Physical Exam  GENERAL: The patient is AO x3, in no acute distress. HEENT: Head is normocephalic and atraumatic. EOMI are intact. Mouth is well hydrated and without lesions. NECK: Supple. No masses LUNGS: Clear to auscultation. No presence of rhonchi/wheezing/rales. Adequate chest expansion HEART: RRR, normal s1 and s2. ABDOMEN: Soft, nontender, no guarding, no peritoneal signs, and nondistended. BS +. No masses. EXTREMITIES: Without any cyanosis, clubbing, rash, lesions or edema. NEUROLOGIC: AOx3, no focal motor deficit. SKIN: no jaundice, no rashes  Assessment/Plan  71 year old female with medical history of depression, diabetes, GERD, hyperlipidemia, hypertension, coming for screening colonoscopy.The patient is at average risk for colorectal cancer.  We will proceed with colonoscopy today.   Harvel Quale, MD 01/11/2022, 10:58 AM

## 2022-01-12 ENCOUNTER — Encounter (HOSPITAL_COMMUNITY)
Admission: RE | Disposition: A | Discharge status: Home / Self Care | Payer: Self-pay | Source: Home / Self Care | Attending: Gastroenterology

## 2022-01-12 ENCOUNTER — Encounter (HOSPITAL_COMMUNITY): Payer: Self-pay | Admitting: Gastroenterology

## 2022-01-12 ENCOUNTER — Ambulatory Visit (HOSPITAL_BASED_OUTPATIENT_CLINIC_OR_DEPARTMENT_OTHER): Payer: Medicare Other | Admitting: Anesthesiology

## 2022-01-12 ENCOUNTER — Ambulatory Visit (HOSPITAL_COMMUNITY): Payer: Medicare Other | Admitting: Anesthesiology

## 2022-01-12 ENCOUNTER — Ambulatory Visit (HOSPITAL_COMMUNITY)
Admission: RE | Admit: 2022-01-12 | Discharge: 2022-01-12 | Disposition: A | Discharge status: Home / Self Care | Payer: Medicare Other | Attending: Gastroenterology | Admitting: Gastroenterology

## 2022-01-12 DIAGNOSIS — K635 Polyp of colon: Secondary | ICD-10-CM

## 2022-01-12 DIAGNOSIS — D122 Benign neoplasm of ascending colon: Secondary | ICD-10-CM | POA: Insufficient documentation

## 2022-01-12 DIAGNOSIS — Z87891 Personal history of nicotine dependence: Secondary | ICD-10-CM | POA: Insufficient documentation

## 2022-01-12 DIAGNOSIS — K5730 Diverticulosis of large intestine without perforation or abscess without bleeding: Secondary | ICD-10-CM

## 2022-01-12 DIAGNOSIS — Z7984 Long term (current) use of oral hypoglycemic drugs: Secondary | ICD-10-CM | POA: Insufficient documentation

## 2022-01-12 DIAGNOSIS — K219 Gastro-esophageal reflux disease without esophagitis: Secondary | ICD-10-CM | POA: Diagnosis not present

## 2022-01-12 DIAGNOSIS — Z1211 Encounter for screening for malignant neoplasm of colon: Secondary | ICD-10-CM

## 2022-01-12 DIAGNOSIS — Z79899 Other long term (current) drug therapy: Secondary | ICD-10-CM | POA: Diagnosis not present

## 2022-01-12 DIAGNOSIS — F419 Anxiety disorder, unspecified: Secondary | ICD-10-CM | POA: Insufficient documentation

## 2022-01-12 DIAGNOSIS — K648 Other hemorrhoids: Secondary | ICD-10-CM

## 2022-01-12 DIAGNOSIS — E119 Type 2 diabetes mellitus without complications: Secondary | ICD-10-CM | POA: Insufficient documentation

## 2022-01-12 DIAGNOSIS — I10 Essential (primary) hypertension: Secondary | ICD-10-CM | POA: Diagnosis not present

## 2022-01-12 HISTORY — PX: COLONOSCOPY WITH PROPOFOL: SHX5780

## 2022-01-12 HISTORY — PX: POLYPECTOMY: SHX149

## 2022-01-12 LAB — GLUCOSE, CAPILLARY: Glucose-Capillary: 100 mg/dL — ABNORMAL HIGH (ref 70–99)

## 2022-01-12 SURGERY — COLONOSCOPY WITH PROPOFOL
Anesthesia: General

## 2022-01-12 MED ORDER — LACTATED RINGERS IV SOLN
INTRAVENOUS | Status: DC
  Administered 2022-01-12: 1000 mL via INTRAVENOUS

## 2022-01-12 MED ORDER — PROPOFOL 500 MG/50ML IV EMUL
INTRAVENOUS | Status: DC | PRN
  Administered 2022-01-12: 150 ug/kg/min via INTRAVENOUS

## 2022-01-12 MED ORDER — PROPOFOL 10 MG/ML IV BOLUS
INTRAVENOUS | Status: DC | PRN
  Administered 2022-01-12: 20 mg via INTRAVENOUS
  Administered 2022-01-12 (×2): 50 mg via INTRAVENOUS
  Administered 2022-01-12: 20 mg via INTRAVENOUS
  Administered 2022-01-12 (×2): 50 mg via INTRAVENOUS

## 2022-01-12 NOTE — Interval H&P Note (Signed)
History and Physical Interval Note:  01/12/2022 1:19 PM  Tammy Hudson  has presented today for surgery, with the diagnosis of Screening Colonoscopy inadequate prep.  The various methods of treatment have been discussed with the patient and family. After consideration of risks, benefits and other options for treatment, the patient has consented to  Procedure(s) with comments: COLONOSCOPY WITH PROPOFOL (N/A) - 220 pre op done already as a surgical intervention.  The patient's history has been reviewed, patient examined, no change in status, stable for surgery.  I have reviewed the patient's chart and labs.  Questions were answered to the patient's satisfaction.     Maylon Peppers [place]

## 2022-01-12 NOTE — Anesthesia Postprocedure Evaluation (Signed)
Anesthesia Post Note  Patient: Tammy Hudson  Procedure(s) Performed: COLONOSCOPY WITH PROPOFOL POLYPECTOMY INTESTINAL  Patient location during evaluation: Short Stay Anesthesia Type: General Level of consciousness: awake and alert Pain management: pain level controlled Vital Signs Assessment: post-procedure vital signs reviewed and stable Respiratory status: spontaneous breathing Cardiovascular status: blood pressure returned to baseline Postop Assessment: no apparent nausea or vomiting Anesthetic complications: no   No notable events documented.   Last Vitals:  Vitals:   01/12/22 1326 01/12/22 1535  BP: (!) 168/77 (!) 154/82  Pulse:  87  Resp: 14 15  Temp: 36.6 C 36.5 C  SpO2: 95% 96%    Last Pain:  Vitals:   01/12/22 1535  TempSrc: Oral  PainSc:                  Tressie Stalker

## 2022-01-12 NOTE — Transfer of Care (Signed)
Immediate Anesthesia Transfer of Care Note  Patient: Tammy Hudson  Procedure(s) Performed: COLONOSCOPY WITH PROPOFOL POLYPECTOMY INTESTINAL  Patient Location: Short Stay  Anesthesia Type:General  Level of Consciousness: awake  Airway & Oxygen Therapy: Patient Spontanous Breathing  Post-op Assessment: Report given to RN  Post vital signs: Reviewed and stable  Last Vitals:  Vitals Value Taken Time  BP 154/82 01/12/22 1535  Temp 36.5 C 01/12/22 1535  Pulse 87 01/12/22 1535  Resp 15 01/12/22 1535  SpO2 96 % 01/12/22 1535    Last Pain:  Vitals:   01/12/22 1535  TempSrc: Oral  PainSc:       Patients Stated Pain Goal: 8 (43/15/40 0867)  Complications: No notable events documented.

## 2022-01-12 NOTE — Op Note (Signed)
[facility] Patient Name: Tammy Hudson Procedure Date: 01/12/2022 1:52 PM MRN: [id number] Date of Birth: February 02, 1951 Attending MD: Maylon Peppers ,  CSN: [id number] Age: 71 Admit Type: Outpatient Procedure:                Colonoscopy Indications:              Screening for colorectal malignant neoplasm,                            inadequate bowel prep on last colonoscopy (more                            recent than 10 years ago) Providers:                Maylon Peppers, Caprice Kluver, Wymore Page Referring MD:              Medicines:                Monitored Anesthesia Care Complications:            No immediate complications. Estimated Blood Loss:     Estimated blood loss: none. Procedure:                Pre-Anesthesia Assessment:                           - Prior to the procedure, a History and Physical                            was performed, and patient medications, allergies                            and sensitivities were reviewed. The patient's                            tolerance of previous anesthesia was reviewed.                           - The risks and benefits of the procedure and the                            sedation options and risks were discussed with the                            patient. All questions were answered and informed                            consent was obtained.                           - ASA Grade Assessment: II - A patient with mild                            systemic disease.                           After obtaining informed consent, the colonoscope  was passed under direct vision. Throughout the                            procedure, the patient's blood pressure, pulse, and                            oxygen saturations were monitored continuously. The                            [id number]-HQ190L ([id number]) scope was introduced through                            the anus and advanced to the the cecum, identified                             by appendiceal orifice and ileocecal valve. The                            colonoscopy was performed without difficulty. The                            patient tolerated the procedure well. The quality                            of the bowel preparation was excellent. Scope In: 3:08:34 PM Scope Out: 3:28:55 PM Scope Withdrawal Time: 0 hours 13 minutes 58 seconds  Total Procedure Duration: 0 hours 20 minutes 21 seconds  Findings:      The perianal and digital rectal examinations were normal.      A 1 mm polyp was found in the ascending colon. The polyp was sessile.       The polyp was removed with a cold snare. Resection and retrieval were       complete.      A 3 mm polyp was found in the sigmoid colon. The polyp was sessile. The       polyp was removed with a cold snare. Resection and retrieval were       complete.      Multiple small and large-mouthed diverticula were found in the sigmoid       colon and descending colon.      Non-bleeding internal hemorrhoids were found during retroflexion. The       hemorrhoids were small. Impression:               - One 1 mm polyp in the ascending colon, removed                            with a cold snare. Resected and retrieved.                           - One 3 mm polyp in the sigmoid colon, removed with                            a cold snare. Resected and retrieved.                           -  Diverticulosis in the sigmoid colon and in the                            descending colon.                           - Non-bleeding internal hemorrhoids. Moderate Sedation:      Per Anesthesia Care Recommendation:           - Discharge patient to home (ambulatory).                           - Resume previous diet.                           - Await pathology results.                           - Repeat colonoscopy date to be determined after                            pending pathology results are reviewed for                             screening purposes. Procedure Code(s):        --- Professional ---                           [id number], Colonoscopy, flexible; with removal of                            tumor(s), polyp(s), or other lesion(s) by snare                            technique Diagnosis Code(s):        --- Professional ---                           Z12.11, Encounter for screening for malignant                            neoplasm of colon                           K63.5, Polyp of colon                           K64.8, Other hemorrhoids                           K57.30, Diverticulosis of large intestine without                            perforation or abscess without bleeding CPT copyright 2019 [employer]. All rights reserved. The codes documented in this report are preliminary and upon coder review may  be revised to meet current compliance requirements. Maylon Peppers, MD Maylon Peppers,  01/12/2022 3:33:43 PM This report has been signed electronically. Number  of Addenda: 0

## 2022-01-12 NOTE — Anesthesia Preprocedure Evaluation (Signed)
Anesthesia Evaluation  Patient identified by MRN, date of birth, ID band Patient awake    Reviewed: Allergy & Precautions, NPO status , Patient's Chart, lab work & pertinent test results  Airway Mallampati: III  TM Distance: >3 FB Neck ROM: Full    Dental  (+) Dental Advisory Given, Upper Dentures, Lower Dentures   Pulmonary shortness of breath and with exertion, asthma , former smoker,    Pulmonary exam normal breath sounds clear to auscultation       Cardiovascular hypertension, Pt. on medications + DOE  Normal cardiovascular exam Rhythm:Regular Rate:Normal     Neuro/Psych PSYCHIATRIC DISORDERS Anxiety Depression  Neuromuscular disease    GI/Hepatic Neg liver ROS, GERD  Medicated,  Endo/Other  diabetes, Well Controlled, Type 2, Oral Hypoglycemic Agents  Renal/GU negative Renal ROS  negative genitourinary   Musculoskeletal  (+) Arthritis , Osteoarthritis,    Abdominal   Peds negative pediatric ROS (+)  Hematology negative hematology ROS (+)   Anesthesia Other Findings Left sciatica Snoring   Reproductive/Obstetrics negative OB ROS                             Anesthesia Physical  Anesthesia Plan  ASA: 2  Anesthesia Plan: General   Post-op Pain Management: Minimal or no pain anticipated   Induction: Intravenous  PONV Risk Score and Plan: Propofol infusion  Airway Management Planned: Nasal Cannula and Natural Airway  Additional Equipment:   Intra-op Plan:   Post-operative Plan:   Informed Consent: I have reviewed the patients History and Physical, chart, labs and discussed the procedure including the risks, benefits and alternatives for the proposed anesthesia with the patient or authorized representative who has indicated his/her understanding and acceptance.     Dental advisory given  Plan Discussed with: CRNA and Surgeon  Anesthesia Plan Comments:          Anesthesia Quick Evaluation

## 2022-01-13 ENCOUNTER — Ambulatory Visit (HOSPITAL_COMMUNITY)
Admission: RE | Admit: 2022-01-13 | Discharge: 2022-01-13 | Disposition: A | Discharge status: Home / Self Care | Payer: Medicare Other | Source: Ambulatory Visit | Attending: Internal Medicine | Admitting: Internal Medicine

## 2022-01-13 DIAGNOSIS — R0609 Other forms of dyspnea: Secondary | ICD-10-CM | POA: Diagnosis present

## 2022-01-14 LAB — SURGICAL PATHOLOGY

## 2022-01-18 ENCOUNTER — Encounter (HOSPITAL_COMMUNITY): Payer: Self-pay | Admitting: Gastroenterology

## 2022-01-19 NOTE — Telephone Encounter (Signed)
Pt was seen for an OV 5/30 after calling the office. Nothing further needed.

## 2022-01-27 ENCOUNTER — Telehealth: Payer: Self-pay

## 2022-01-27 NOTE — Telephone Encounter (Signed)
Ok to schedule per MMM

## 2022-01-28 ENCOUNTER — Ambulatory Visit (INDEPENDENT_AMBULATORY_CARE_PROVIDER_SITE_OTHER): Payer: Medicare Other | Admitting: Internal Medicine

## 2022-01-28 ENCOUNTER — Encounter: Payer: Self-pay | Admitting: Internal Medicine

## 2022-01-28 DIAGNOSIS — J45991 Cough variant asthma: Secondary | ICD-10-CM | POA: Diagnosis not present

## 2022-01-28 DIAGNOSIS — R0609 Other forms of dyspnea: Secondary | ICD-10-CM | POA: Diagnosis not present

## 2022-01-28 NOTE — Patient Instructions (Addendum)
Plan A = Automatic = Always=    advair 115  1-2 puffs every 12 hours (ok to substitute breztri sample until you get your new supply)  Work on inhaler technique:  relax and gently blow all the way out then take a nice smooth full deep breath back in, triggering the inhaler at same time you start breathing in.  Hold for up to 5 seconds if you can. Blow out thru nose. Rinse and gargle with water when done.  If mouth or throat bother you at all,  try brushing teeth/gums/tongue with arm and hammer toothpaste/ make a slurry and gargle and spit out.   - use empty device to take practice breaths   Plan B = Backup (to supplement plan A, not to replace it) Only use your albuterol nebulizer  as a rescue medication to be used if you can't catch your breath by resting or doing a relaxed purse lip breathing pattern.  - The less you use it, the better it will work when you need it. - Ok to use the inhaler up to  every 4 hours if you must but call for appointment if use goes up over your usual need  Please schedule a follow up visit in 6  months but call sooner if needed - bring inhaler

## 2022-01-28 NOTE — Assessment & Plan Note (Addendum)
Onset was xmas 2022 on a background of ? Allergic rhinitis and improved on high dose prednisone - 11/05/2021   try advair 115 2bid since insurance won't cover symbicort - 01/28/2022  After extensive coaching inhaler device,  effectiveness =    75% (short Ti) > continue advair 115 one - two bid   Despite poor hfa,   All goals of chronic asthma control met including optimal function and elimination of symptoms with minimal need for rescue therapy.  Contingencies discussed in full including contacting this office immediately if not controlling the symptoms using the rule of two's.      rec try the lower dose of advair, esp since hfa 2x as good now as baseline and also on low dose systemic steroids anyway    F/u can be q 71m sooner prn

## 2022-01-28 NOTE — Progress Notes (Signed)
Tammy Hudson, female    DOB: [birth year], 71 y.o.   MRN: [id number]   Brief patient profile:  70  yobf quit smoking 1996 with tendency to bad bronchitis needing breathing treatments says completely resolved w/in 6 m and dx RA around 2020 on daily prednisone since then referred to pulmonary clinic in [facility]  11/05/2021 by ER/[facility]  for cough x 3 months in setting of rhinitis x 2016 controlled  with allegra and addition of singulair by PCP at onset of cough but no improvement with either anihistamines or singulair  Covid  19 in Aug 2022  History of Present Illness  11/05/2021  Pulmonary/ 1st office eval/ [facility] / [facility]  Chief Complaint  Patient presents with   Consult    Ref for coughing and bronchitis.   Dyspnea:  much better p rx by ER = increase pred to 40 mg  from "2 mg daily"maint Cough: some p hs but much better also and non productive / was quite violent apparently with streaks of blood resolved now Sleep: 30 degrees with pillows, bed flat o/w overt hB SABA use:  Rhinitis worse at onset of cough  Rec Symbicort 80  Take up to 2 puffs first thing in am and then another 2 puffs about 12 hours later.  Reduce prednisone to where it was before  As long as any cough day or night:  Try prilosec otc '20mg'$   Take 30-60 min before first meal of the day and Pepcid ac (famotidine) 20 mg after supper or an hour before bedtime until cough is completely gone for at least a week without the need for cough suppression GERD diet reviewed, bed blocks rec   Please schedule a follow up office visit in 6 weeks, call sooner if needed with meds Ned Clines      12/21/2021  f/u ov/[place] office/[facility] [place] re: advair 115  maint on prednisone 5 mg   - did not bring all meds or any inhalers  Chief Complaint  Patient presents with   Follow-up    Cough has been on and off.  Can sometimes taste blood and has seen blood while coughing.   Dyspnea:  food lion x 2 aisles all she can do  Cough: at hs and  then clears and sleeps  ok  and no flare in am > tastes blood but can't ever see it, mucus is always white / no epistaxis  Sleeping: flat bed 3 pillows  SABA use: twice a weekly seems to help cough / never rechallenges p exertion and turns out only uses advair p exertion provokes sob but feels it helps her recover within secs - never rechallenges  02: none  Covid status: vax x "all of em" Rec Advair 115 should be Take 2 puffs first thing in am and then another 2 puffs about 12 hours later.  Work on inhaler technique: Only use your albuterol as a rescue medication   Please schedule a follow up office visit in 4 weeks, sooner if needed - bring your inhalers/nebulizer solution "   01/28/2022  f/u ov/[place] office/[facility] [place] re: cough variant asthma maint on advair 115  2 bid (poor hfa/empty device since ? )  and prednisone 2.5 mg per rheumatology x 4 years Chief Complaint  Patient presents with   Follow-up    Breathing has improved.     Dyspnea:  shopping fine but uses [facility] parking  / housework ok  Cough: gone  Sleeping: flat bed 3 pillows  SABA use: not needing  02: none      No obvious day to day or daytime variability or assoc excess/ purulent sputum or mucus plugs or hemoptysis or cp or chest tightness, subjective wheeze or overt sinus or hb symptoms.   Sleeping  without nocturnal  or early am exacerbation  of respiratory  c/o's or need for noct saba. Also denies any obvious fluctuation of symptoms with weather or environmental changes or other aggravating or alleviating factors except as outlined above   No unusual exposure hx or h/o childhood pna/ asthma or knowledge of premature birth.  Current Allergies, Complete Past Medical History, Past Surgical History, Family History, and Social History were reviewed in [employer] record.  ROS  The following are not active complaints unless bolded Hoarseness, sore throat, dysphagia, dental problems, itching, sneezing,   nasal congestion or discharge of excess mucus or purulent secretions, ear ache,   fever, chills, sweats, unintended wt loss or wt gain, classically pleuritic or exertional cp,  orthopnea pnd or arm/hand swelling  or leg swelling, presyncope, palpitations, abdominal pain, anorexia, nausea, vomiting, diarrhea  or change in bowel habits or change in bladder habits, change in stools or change in urine, dysuria, hematuria,  rash, arthralgias, visual complaints, headache, numbness, weakness or ataxia or problems with walking or coordination,  change in mood or  memory.        Current Meds  Medication Sig   albuterol (PROVENTIL) (2.5 MG/3ML) 0.083% nebulizer solution Inhale 3 mLs into the lungs every 4 (four) hours as needed for shortness of breath.   ALPRAZolam (XANAX) 0.25 MG tablet TAKE 1 TABLET BY MOUTH TWICE DAILY AS NEEDED   benzonatate (TESSALON) 100 MG capsule Take 100 mg by mouth 3 (three) times daily as needed for cough.   Cinnamon 500 MG capsule Take 500 mg by mouth daily.   cyclobenzaprine (FLEXERIL) 10 MG tablet Take 10 mg by mouth daily as needed for muscle spasms.   DULoxetine (CYMBALTA) 30 MG capsule Take 30 mg by mouth daily as needed (RA pain).   escitalopram (LEXAPRO) 20 MG tablet Take 20 mg by mouth daily as needed (depression).   fexofenadine (ALLEGRA) 180 MG tablet Take 180 mg by mouth daily.   fluticasone-salmeterol (ADVAIR HFA) 115-21 MCG/ACT inhaler Inhale 2 puffs into the lungs 2 (two) times daily.   folic acid (FOLVITE) 161 MCG tablet Take 800 mcg by mouth daily.   losartan (COZAAR) 50 MG tablet Take 50 mg by mouth daily.   methotrexate (RHEUMATREX) 2.5 MG tablet Take 15 mg by mouth every Tuesday.   montelukast (SINGULAIR) 10 MG tablet Take 10 mg by mouth at bedtime.   ORENCIA CLICKJECT 096 MG/ML SOAJ Inject 1 Syringe into the skin every Wednesday.   polyethylene glycol-electrolytes (TRILYTE) 420 g solution Take 4,000 mLs by mouth as directed.   predniSONE (DELTASONE) 20 MG  tablet 2 tabs po daily x 3 days   predniSONE (DELTASONE) 5 MG tablet Take 5 mg by mouth daily.   simvastatin (ZOCOR) 20 MG tablet Take 20 mg by mouth at bedtime.   zolpidem (AMBIEN) 10 MG tablet Take 10 mg by mouth at bedtime as needed for sleep.               Past Medical History:  Diagnosis Date   Allergy    Anxiety    Arthritis    Depression    Diabetes mellitus without complication (HCC)    GERD (gastroesophageal reflux disease)    Hyperlipidemia    Hypertension  Neuromuscular disorder ([place])        Objective:      01/28/2022         215   12/21/21 203 lb (92.1 kg)  11/05/21 203 lb 9.6 oz (92.4 kg)  10/31/21 200 lb (90.7 kg)     Vital signs reviewed  01/28/2022  - Note at rest 02 sats  95% on RA   General appearance:    pleasant amb bf nad   HEENT : Oropharynx  clear/edentulous      Nasal turbinates nl    NECK :  without  apparent JVD/ palpable Nodes/TM    LUNGS: no acc muscle use,  Nl contour chest which is clear to A and P bilaterally without cough on insp or exp maneuvers   CV:  RRR  no s3 or murmur or increase in P2, and no edema   ABD:  obese soft and nontender with nl inspiratory excursion in the supine position. No bruits or organomegaly appreciated   MS:  Nl gait/ ext warm without deformities Or obvious joint restrictions  calf tenderness, cyanosis or clubbing    SKIN: warm and dry without lesions    NEURO:  alert, approp, nl sensorium with  no motor or cerebellar deficits apparent.             I personally reviewed images and agree with radiology impression as follows:   Chest HRCT    01/13/22 1. No evidence of interstitial lung disease. 2. Three-vessel coronary atherosclerosis. 3. Small pericardial effusion/thickening. 4. Diffuse hepatic steatosis. 5. Aortic Atherosclerosis (ICD10-I70.0) and Emphysema (ICD10-J43.9).   Assessment

## 2022-01-28 NOTE — Telephone Encounter (Signed)
Pt aware ok to schedule with MMM. She will call back to make an apt.

## 2022-01-28 NOTE — Assessment & Plan Note (Signed)
August  2022  Onset with covid / cough  - 12/21/2021   Walked on RA  x  3  lap(s) =  approx 450  ft  @ moderate pace, stopped due to end of study s sob  with lowest 02 sats 94%  - HRCT chest 01/13/22  No ILD   Improved to her satisfaction with no evidence of RA lung dz/ no further w/u needed          Each maintenance medication was reviewed in detail including emphasizing most importantly the difference between maintenance and prns and under what circumstances the prns are to be triggered using an action plan format where appropriate.  Total time for H and P, chart review, counseling, reviewing hfa/neb device(s) and generating customized AVS unique to this office visit / same day charting = 34 min

## 2022-03-03 ENCOUNTER — Ambulatory Visit: Payer: Medicare Other | Admitting: Nurse Practitioner

## 2022-03-03 ENCOUNTER — Telehealth: Payer: Self-pay | Admitting: Adult Health Nurse Practitioner

## 2022-04-11 ENCOUNTER — Encounter: Payer: Self-pay | Admitting: Nurse Practitioner

## 2022-04-11 ENCOUNTER — Ambulatory Visit (INDEPENDENT_AMBULATORY_CARE_PROVIDER_SITE_OTHER): Payer: Medicare Other | Admitting: Nurse Practitioner

## 2022-04-11 VITALS — BP 170/82 | HR 94 | Temp 97.0°F | Resp 20 | Ht 67.0 in | Wt 230.0 lb

## 2022-04-11 DIAGNOSIS — E559 Vitamin D deficiency, unspecified: Secondary | ICD-10-CM

## 2022-04-11 DIAGNOSIS — E1142 Type 2 diabetes mellitus with diabetic polyneuropathy: Secondary | ICD-10-CM | POA: Diagnosis not present

## 2022-04-11 DIAGNOSIS — I10 Essential (primary) hypertension: Secondary | ICD-10-CM | POA: Diagnosis not present

## 2022-04-11 DIAGNOSIS — E782 Mixed hyperlipidemia: Secondary | ICD-10-CM | POA: Diagnosis not present

## 2022-04-11 DIAGNOSIS — R0609 Other forms of dyspnea: Secondary | ICD-10-CM

## 2022-04-11 DIAGNOSIS — G8929 Other chronic pain: Secondary | ICD-10-CM

## 2022-04-11 DIAGNOSIS — M5442 Lumbago with sciatica, left side: Secondary | ICD-10-CM

## 2022-04-11 LAB — BAYER DCA HB A1C WAIVED: HB A1C (BAYER DCA - WAIVED): 6.8 % — ABNORMAL HIGH (ref 4.8–5.6)

## 2022-04-11 MED ORDER — ESCITALOPRAM OXALATE 20 MG PO TABS
20.0000 mg | ORAL_TABLET | Freq: Every day | ORAL | 1 refills | Status: DC | PRN
Start: 2022-04-11 — End: 2022-07-05

## 2022-04-11 MED ORDER — LOSARTAN POTASSIUM 100 MG PO TABS: 100.0000 mg | ORAL_TABLET | Freq: Every day | ORAL | 1 refills | Status: DC

## 2022-04-11 MED ORDER — SIMVASTATIN 20 MG PO TABS
20.0000 mg | ORAL_TABLET | Freq: Every day | ORAL | 1 refills | Status: DC
Start: 2022-04-11 — End: 2022-08-23

## 2022-04-11 MED ORDER — DULOXETINE HCL 30 MG PO CPEP
30.0000 mg | ORAL_CAPSULE | Freq: Every day | ORAL | 1 refills | Status: DC | PRN
Start: 2022-04-11 — End: 2022-10-17

## 2022-04-11 NOTE — Progress Notes (Signed)
Subjective:    Patient ID: Tammy Hudson, female    DOB: 12-Oct-1950, 71 y.o.   MRN: [id number]   Chief Complaint: medical management of chronic issues     HPI:  Tammy Hudson is a 71 y.o. who identifies as a female who was assigned female at birth.   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 Essential hypertension No c/o chest pain, sob or headache. Does check blood pressure at home. Has been running high. BP Readings from Last 3 Encounters:  01/28/22 (!) 142/92  01/12/22 (!) 154/82  01/11/22 138/63     2. Mixed hyperlipidemia Doe snot watch diet and does no dedicated exercise. Lab Results  Component Value Date   CHOL 179 02/08/2016   HDL 46 02/08/2016   LDLCALC 92 02/08/2016   TRIG 204 (H) 02/08/2016   CHOLHDL 3.9 02/08/2016     3. Type 2 diabetes mellitus with peripheral neuropathy [facility]) Patient has not been checking her blood sugars.  Lab Results  Component Value Date   HGBA1C 5.6 08/30/2016     4. Vitamin D deficiency Is on daily vitamind supplement  5. DOE (dyspnea on exertion) Has occasionally but only with over exertion  6. Chronic left-sided low back pain with left-sided sciatica Has chronic back pain. Rates pain 8/10 currently. Has intermittently but is tolerable.   New complaints: None today  No Known Allergies Outpatient Encounter Medications as of 04/11/2022  Medication Sig   albuterol (PROVENTIL) (2.5 MG/3ML) 0.083% nebulizer solution Inhale 3 mLs into the lungs every 4 (four) hours as needed for shortness of breath.   ALPRAZolam (XANAX) 0.25 MG tablet TAKE 1 TABLET BY MOUTH TWICE DAILY AS NEEDED   benzonatate (TESSALON) 100 MG capsule Take 100 mg by mouth 3 (three) times daily as needed for cough.   Cinnamon 500 MG capsule Take 500 mg by mouth daily.   cyclobenzaprine (FLEXERIL) 10 MG tablet Take 10 mg by mouth daily as  needed for muscle spasms.   DULoxetine (CYMBALTA) 30 MG capsule Take 30 mg by mouth daily as needed (RA pain).   escitalopram (LEXAPRO) 20 MG tablet Take 20 mg by mouth daily as needed (depression).   fexofenadine (ALLEGRA) 180 MG tablet Take 180 mg by mouth daily.   fluticasone-salmeterol (ADVAIR HFA) 115-21 MCG/ACT inhaler Inhale 2 puffs into the lungs 2 (two) times daily.   folic acid (FOLVITE) 397 MCG tablet Take 800 mcg by mouth daily.   losartan (COZAAR) 50 MG tablet Take 50 mg by mouth daily.   methotrexate (RHEUMATREX) 2.5 MG tablet Take 15 mg by mouth every Tuesday.   montelukast (SINGULAIR) 10 MG tablet Take 10 mg by mouth at bedtime.   ORENCIA CLICKJECT 673 MG/ML SOAJ Inject 1 Syringe into the skin every Wednesday.   polyethylene glycol-electrolytes (TRILYTE) 420 g solution Take 4,000 mLs by mouth as directed.   predniSONE (DELTASONE) 20 MG tablet 2 tabs po daily x 3 days   predniSONE (DELTASONE) 5 MG tablet Take 5 mg by mouth daily.   simvastatin (ZOCOR) 20 MG tablet Take 20 mg by mouth at bedtime.   zolpidem (AMBIEN) 10 MG tablet Take 10 mg by mouth at bedtime as needed for sleep.   No facility-administered encounter medications on file as of 04/11/2022.    Past Surgical History:  Procedure Laterality Date   ABDOMINAL HYSTERECTOMY  COLONOSCOPY WITH PROPOFOL N/A 01/12/2022   Procedure: COLONOSCOPY WITH PROPOFOL;  Surgeon: Harvel Quale, MD;  Location: [facility];  Service: Gastroenterology;  Laterality: N/A;  220 pre op done already   FLEXIBLE SIGMOIDOSCOPY  01/11/2022   Procedure: FLEXIBLE SIGMOIDOSCOPY;  Surgeon: Harvel Quale, MD;  Location: [facility];  Service: Gastroenterology;;   POLYPECTOMY  01/12/2022   Procedure: POLYPECTOMY INTESTINAL;  Surgeon: Harvel Quale, MD;  Location: [facility];  Service: Gastroenterology;;    No family history on file.    Controlled substance contract: n/a     Review of Systems   Constitutional:  Negative for diaphoresis.  Eyes:  Negative for pain.  Respiratory:  Negative for shortness of breath.   Cardiovascular:  Negative for chest pain, palpitations and leg swelling.  Gastrointestinal:  Negative for abdominal pain.  Endocrine: Negative for polydipsia.  Skin:  Negative for rash.  Neurological:  Negative for dizziness, weakness and headaches.  Hematological:  Does not bruise/bleed easily.  All other systems reviewed and are negative.      Objective:   Physical Exam Vitals and nursing note reviewed.  Constitutional:      General: She is not in acute distress.    Appearance: Normal appearance. She is well-developed.  HENT:     Head: Normocephalic.     Right Ear: Tympanic membrane normal.     Left Ear: Tympanic membrane normal.     Nose: Nose normal.     Mouth/Throat:     Mouth: Mucous membranes are moist.  Eyes:     Pupils: Pupils are equal, round, and reactive to light.  Neck:     Vascular: No carotid bruit or JVD.  Cardiovascular:     Rate and Rhythm: Normal rate and regular rhythm.     Heart sounds: Normal heart sounds.  Pulmonary:     Effort: Pulmonary effort is normal. No respiratory distress.     Breath sounds: Normal breath sounds. No wheezing or rales.  Chest:     Chest wall: No tenderness.  Abdominal:     General: Bowel sounds are normal. There is no distension or abdominal bruit.     Palpations: Abdomen is soft. There is no hepatomegaly, splenomegaly, mass or pulsatile mass.     Tenderness: There is no abdominal tenderness.  Musculoskeletal:        General: Normal range of motion.     Cervical back: Normal range of motion and neck supple.  Lymphadenopathy:     Cervical: No cervical adenopathy.  Skin:    General: Skin is warm and dry.  Neurological:     Mental Status: She is alert and oriented to person, place, and time.     Deep Tendon Reflexes: Reflexes are normal and symmetric.  Psychiatric:        Behavior: Behavior normal.         Thought Content: Thought content normal.        Judgment: Judgment normal.    BP (!) 170/82   Pulse 94   Temp (!) 97 F (36.1 C) (Temporal)   Resp 20   Ht '5\' 7"'  (1.702 m)   Wt 230 lb (104.3 kg)   SpO2 95%   BMI 36.02 kg/m    HGBA1c 6.8%       Assessment & Plan:  Tammy Hudson comes in today with chief complaint of Annual Exam   Diagnosis and orders addressed:  1. Essential hypertension Low sodium diet - CBC with Differential/Platelet -  CMP14+EGFR - losartan (COZAAR) 100 MG tablet; Take 1 tablet (100 mg total) by mouth daily.  Dispense: 90 tablet; Refill: 1  2. Mixed hyperlipidemia Low fat diet - Lipid panel  3. Type 2 diabetes mellitus with peripheral neuropathy (HCC) Continue to watch carbs in diet - Bayer DCA Hb A1c Waived  4. Vitamin D deficiency Continue vitamin d   5. DOE (dyspnea on exertion)  6. Chronic left-sided low back pain with left-sided sciatica Moist heat rest   Labs pending Health Maintenance reviewed Diet and exercise encouraged  Follow up plan: 3 months   Mary-Margaret Hassell Done, FNP

## 2022-04-12 LAB — CBC WITH DIFFERENTIAL/PLATELET
Basophils Absolute: 0 10*3/uL (ref 0.0–0.2)
Basos: 1 %
EOS (ABSOLUTE): 0.1 10*3/uL (ref 0.0–0.4)
Eos: 1 %
Hematocrit: 42.8 % (ref 34.0–46.6)
Hemoglobin: 13.5 g/dL (ref 11.1–15.9)
Immature Grans (Abs): 0 10*3/uL (ref 0.0–0.1)
Immature Granulocytes: 1 %
Lymphocytes Absolute: 1 10*3/uL (ref 0.7–3.1)
Lymphs: 17 %
MCH: 29.2 pg (ref 26.6–33.0)
MCHC: 31.5 g/dL (ref 31.5–35.7)
MCV: 93 fL (ref 79–97)
Monocytes Absolute: 0.5 10*3/uL (ref 0.1–0.9)
Monocytes: 7 %
Neutrophils Absolute: 4.7 10*3/uL (ref 1.4–7.0)
Neutrophils: 73 %
Platelets: 284 10*3/uL (ref 150–450)
RBC: 4.62 x10E6/uL (ref 3.77–5.28)
RDW: 13.3 % (ref 11.7–15.4)
WBC: 6.3 10*3/uL (ref 3.4–10.8)

## 2022-04-12 LAB — LIPID PANEL
Chol/HDL Ratio: 3.4 ratio (ref 0.0–4.4)
Cholesterol, Total: 206 mg/dL — ABNORMAL HIGH (ref 100–199)
HDL: 60 mg/dL (ref >39)
LDL Chol Calc (NIH): 105 mg/dL — ABNORMAL HIGH (ref 0–99)
Triglycerides: 239 mg/dL — ABNORMAL HIGH (ref 0–149)
VLDL Cholesterol Cal: 41 mg/dL — ABNORMAL HIGH (ref 5–40)

## 2022-04-12 LAB — CMP14+EGFR
ALT: 10 IU/L (ref 0–32)
AST: 18 IU/L (ref 0–40)
Albumin/Globulin Ratio: 1.6 (ref 1.2–2.2)
Albumin: 4.4 g/dL (ref 3.8–4.8)
Alkaline Phosphatase: 85 IU/L (ref 44–121)
BUN/Creatinine Ratio: 15 (ref 12–28)
BUN: 19 mg/dL (ref 8–27)
Bilirubin Total: 0.4 mg/dL (ref 0.0–1.2)
CO2: 23 mmol/L (ref 20–29)
Calcium: 9.6 mg/dL (ref 8.7–10.3)
Chloride: 99 mmol/L (ref 96–106)
Creatinine, Ser: 1.24 mg/dL — ABNORMAL HIGH (ref 0.57–1.00)
Globulin, Total: 2.7 g/dL (ref 1.5–4.5)
Glucose: 209 mg/dL — ABNORMAL HIGH (ref 70–99)
Potassium: 4.5 mmol/L (ref 3.5–5.2)
Sodium: 139 mmol/L (ref 134–144)
Total Protein: 7.1 g/dL (ref 6.0–8.5)
eGFR: 47 mL/min/{1.73_m2} — ABNORMAL LOW (ref >59)

## 2022-05-09 ENCOUNTER — Telehealth: Payer: Self-pay | Admitting: Nurse Practitioner

## 2022-05-09 NOTE — Telephone Encounter (Signed)
Patient is calling to ask for a medicine to help with her acid reflux.  Says she has had this for years and has had sx for it, but it has now come back.  I told patient she would likely need to be seen for this due to no prior discussion with her provider at last visit. Please call and let her know what to do.

## 2022-05-10 NOTE — Telephone Encounter (Signed)
If acid reflux is not something she has been seen for in our office, she will need an appointment. But let her know that omeprazole can be bought OTC for acid reflux.

## 2022-05-10 NOTE — Telephone Encounter (Signed)
Patient called again about medication and I gave her the directions from [facility].  Patient verbalized understanding.

## 2022-06-20 LAB — HM DIABETES EYE EXAM

## 2022-06-23 ENCOUNTER — Other Ambulatory Visit: Payer: Self-pay | Admitting: Nurse Practitioner

## 2022-06-23 NOTE — Telephone Encounter (Signed)
Last office visit 04/11/22 Last refill 12/28/21

## 2022-06-24 ENCOUNTER — Other Ambulatory Visit: Payer: Self-pay | Admitting: Nurse Practitioner

## 2022-06-24 MED ORDER — ALBUTEROL SULFATE (2.5 MG/3ML) 0.083% IN NEBU: 3.0000 mL | INHALATION_SOLUTION | RESPIRATORY_TRACT | 0 refills | Status: AC | PRN

## 2022-06-27 ENCOUNTER — Ambulatory Visit (INDEPENDENT_AMBULATORY_CARE_PROVIDER_SITE_OTHER): Payer: Medicare Other

## 2022-06-27 VITALS — Ht 67.0 in | Wt 230.0 lb

## 2022-06-27 DIAGNOSIS — Z0000 Encounter for general adult medical examination without abnormal findings: Secondary | ICD-10-CM

## 2022-06-27 NOTE — Progress Notes (Signed)
Subjective:   Tammy Hudson is a 71 y.o. female who presents for Medicare Annual (Subsequent) preventive examination.  I connected with  Naamah Boggess on 06/27/22 by a audio enabled telemedicine application and verified that I am speaking with the correct person using two identifiers.  Patient Location: Home  Provider Location: Home Office  I discussed the limitations of evaluation and management by telemedicine. The patient expressed understanding and agreed to proceed.   Review of Systems     Cardiac Risk Factors include: advanced age (>81mn, >>23women);hypertension     Objective:    Today's Vitals   06/27/22 1317  Weight: 230 lb (104.3 kg)  Height: '5\' 7"'$  (1.702 m)   Body mass index is 36.02 kg/m.     06/27/2022    1:41 PM 10/31/2021    3:22 PM 11/16/2016    5:46 PM  Advanced Directives  Does Patient Have a Medical Advance Directive? No No No  Would patient like information on creating a medical advance directive? Yes (MAU/Ambulatory/Procedural Areas - Information given)  No - Patient declined    Current Medications (verified) Outpatient Encounter Medications as of 06/27/2022  Medication Sig   albuterol (PROVENTIL) (2.5 MG/3ML) 0.083% nebulizer solution Inhale 3 mLs into the lungs every 4 (four) hours as needed for shortness of breath.   ALPRAZolam (XANAX) 0.25 MG tablet TAKE 1 TABLET BY MOUTH TWICE DAILY AS NEEDED   Cinnamon 500 MG capsule Take 500 mg by mouth daily.   cyclobenzaprine (FLEXERIL) 10 MG tablet Take one tablet (10 mg dose) by mouth at bedtime.   DULoxetine (CYMBALTA) 30 MG capsule Take 1 capsule (30 mg total) by mouth daily as needed (RA pain).   escitalopram (LEXAPRO) 20 MG tablet Take 1 tablet (20 mg total) by mouth daily as needed (depression).   fexofenadine (ALLEGRA) 180 MG tablet Take 180 mg by mouth daily.   fluticasone-salmeterol (ADVAIR HFA) 115-21 MCG/ACT inhaler Inhale 2 puffs into the lungs 2 (two) times daily.   folic acid (FOLVITE) 8086MCG  tablet Take 800 mcg by mouth daily.   losartan (COZAAR) 100 MG tablet Take 1 tablet (100 mg total) by mouth daily.   methotrexate (RHEUMATREX) 2.5 MG tablet Take 15 mg by mouth every Tuesday.   montelukast (SINGULAIR) 10 MG tablet Take 10 mg by mouth at bedtime.   simvastatin (ZOCOR) 20 MG tablet Take 1 tablet (20 mg total) by mouth at bedtime.   zolpidem (AMBIEN) 10 MG tablet Take 10 mg by mouth at bedtime as needed for sleep.   No facility-administered encounter medications on file as of 06/27/2022.    Allergies (verified) Patient has no known allergies.   History: Past Medical History:  Diagnosis Date   Allergy    Anxiety    Arthritis    Cough variant asthma 11/05/2021   Depression    Diabetes mellitus without complication (HCC)    GERD (gastroesophageal reflux disease)    Hyperlipidemia    Hypertension    [place]romuscular disorder [place]    Past Surgical History:  Procedure Laterality Date   ABDOMINAL HYSTERECTOMY     COLONOSCOPY WITH PROPOFOL N/A 01/12/2022   Procedure: COLONOSCOPY WITH PROPOFOL;  Surgeon: CHarvel Quale MD;  [facility];  Service: Gastroenterology;  Laterality: N/A;  220 pre op done already   FLEXIBLE SIGMOIDOSCOPY  01/11/2022   Procedure: FLEXIBLE SIGMOIDOSCOPY;  Surgeon: CHarvel Quale MD;  Location: [facility];  Service: Gastroenterology;;   POLYPECTOMY  01/12/2022   Procedure: POLYPECTOMY  INTESTINAL;  Surgeon: Montez Morita, Quillian Quince, MD;  Location: [facility];  Service: Gastroenterology;;   History reviewed. No pertinent family history. Social History   Socioeconomic History   Marital status: Married    Spouse name: Not on file   Number of children: Not on file   Years of education: Not on file   Highest education level: Not on file  Occupational History   Not on file  Tobacco Use   Smoking status: Former    Types: Cigarettes    Quit date: 04/02/1995    Years since quitting: 27.2   Smokeless tobacco:  Never  Substance and Sexual Activity   Alcohol use: No    Alcohol/week: 0.0 standard drinks of alcohol   Drug use: No   Sexual activity: Not on file  Other Topics Concern   Not on file  Social History Narrative   Retired from the [employer]    2 children    Hus[place] lives in [place] Strain: [place]  (06/27/2022)   Overall Financial Resource Strain (CARDIA)    Difficulty of Paying Living Expenses: Not hard at all  Food Insecurity: No Food Insecurity (06/27/2022)   Hunger Vital Sign    Worried About Running Out of Food in the Last Year: Never true    [place] in the Last Year: Never true  Transportation Needs: No Transportation Needs (06/27/2022)   PRAPARE - Hydrologist (Medical): No    Lack of Transportation (Non-Medical): No  Physical Activity: Sufficiently Active (06/27/2022)   Exercise Vital Sign    Days of Exercise per Week: 5 days    Minutes of Exercise per Session: 30 min  Stress: No Stress Concern Present (06/27/2022)   [place]    Feeling of Stress : Not at all  Social Connections: [place] (06/27/2022)   Social Connection and Isolation Panel [NHANES]    Frequency of Communication with Friends and Family: More than three times a week    Frequency of Social Gatherings with Friends and Family: Three times a week    Attends Religious Services: More than 4 times per year    Active Member of Clubs or Organizations: Yes    Attends Music therapist: More than 4 times per year    Marital Status: Married    Tobacco Counseling Counseling given: Not Answered   Clinical Intake:  Pre-visit preparation completed: Yes  Pain : No/denies pain        How often do you need to have someone help you when you read instructions, pamphlets, or other written materials from your doctor or pharmacy?: 1 -  Never  Diabetic?Yes Nutrition Risk Assessment:  Has the patient had any N/V/D within the last 2 months?  No  Does the patient have any non-healing wounds?  No  Has the patient had any unintentional weight loss or weight gain?  No   Diabetes:  Is the patient diabetic?  Yes  If diabetic, was a CBG obtained today?  No  Did the patient bring in their glucometer from home?  No  How often do you monitor your CBG's? As needed .   Financial Strains and Diabetes Management:  Are you having any financial strains with the device, your supplies or your medication? No .  Does the patient want to be seen by Chronic Care Management for management of  their diabetes?  No  Would the patient like to be referred to a Nutritionist or for Diabetic Management?  No   Diabetic Exams:  Diabetic Eye Exam: Completed with Dr. Wynetta Emery; will obtain results  Diabetic Foot Exam: Completed 04/11/22    Interpreter Needed?: No  Information entered by :: Denman George LPN   Activities of Daily Living    06/27/2022    1:41 PM  In your present state of health, do you have any difficulty performing the following activities:  Hearing? 0  Vision? 0  Difficulty concentrating or making decisions? 0  Walking or climbing stairs? 0  Dressing or bathing? 0  Doing errands, shopping? 0  Preparing Food and eating ? N  Using the Toilet? N  In the past six months, have you accidently leaked urine? N  Do you have problems with loss of bowel control? N  Managing your Medications? N  Managing your Finances? N  Housekeeping or managing your Housekeeping? N    Patient Care Team: Chevis Pretty, FNP as PCP - General (Family Medicine) Lahoma Rocker, MD as Consulting Physician (Rheumatology) Tanda Rockers, MD as Consulting Physician (Pulmonary Disease) Celestia Khat, OD (Optometry)  Indicate any recent Medical Services you may have received from other than [facility] providers in the past year (date may be  approximate).     Assessment:   This is a routine wellness examination for Malissie.  Hearing/Vision screen Hearing Screening - Comments:: No concerns  Vision Screening - Comments:: Wears rx glasses - up to date with routine eye exams with [facility] Dr. Debe Coder     Dietary issues and exercise activities discussed: Current Exercise Habits: Home exercise routine, Type of exercise: stretching, Time (Minutes): 30, Frequency (Times/Week): 5, Weekly Exercise (Minutes/Week): 150, Intensity: Mild   Goals Addressed             This Visit's Progress    Patient Stated       Maintain independence        Depression Screen    06/27/2022    1:35 PM 04/11/2022    2:45 PM 08/30/2016    3:24 PM 06/02/2016    3:03 PM 05/06/2016    3:24 PM 02/15/2016    3:26 PM 12/08/2015   10:30 AM  PHQ 2/9 Scores  PHQ - 2 Score '2 2 3 1 1 '$ 0 0  PHQ- 9 Score '7 7 5        '$ Fall Risk    06/27/2022    1:24 PM 04/11/2022    2:44 PM 06/11/2018    9:05 AM 08/30/2016    3:24 PM 05/06/2016    3:24 PM  Fall Risk   Falls in the past year? 0 0 0 No Yes  Comment   Emmi Telephone Survey: data to providers prior to load    Number falls in past yr: 0    2 or more  Injury with Fall? 0      Follow up Falls evaluation completed;Education provided;Falls prevention discussed        FALL RISK PREVENTION PERTAINING TO THE HOME:  Any stairs in or around the home? No  If so, are there any without handrails? No  Home free of loose throw rugs in walkways, pet beds, electrical cords, etc? Yes  Adequate lighting in your home to reduce risk of falls? Yes   ASSISTIVE DEVICES UTILIZED TO PREVENT FALLS:  Life alert? No  Use of a cane, walker or w/c? No  Grab bars in the bathroom?  Yes  Shower chair or bench in shower? No  Elevated toilet seat or a handicapped toilet? No   TIMED UP AND GO:  Was the test performed? No .  Length of time to ambulate 10 feet: telephonic visit    Cognitive Function:        06/27/2022    1:43  PM  6CIT Screen  What Year? 0 points  What month? 0 points  What time? 0 points  Count back from 20 0 points  Months in reverse 0 points  Repeat phrase 0 points  Total Score 0 points    Immunizations Immunization History  Administered Date(s) Administered   Fluad Quad(high Dose 65+) 05/04/2018   Influenza Split 05/12/2020   Influenza, High Dose Seasonal PF 06/07/2016, 05/04/2018   Influenza,inj,Quad PF,6+ Mos 05/24/2021   Influenza,trivalent, recombinat, inj, PF 05/12/2020   Influenza-Unspecified 05/04/2018   Moderna Sars-Covid-2 Vaccination 08/29/2019, 09/30/2019, 11/23/2020   Pneumococcal Conjugate-13 02/14/2017   Pneumococcal Polysaccharide-23 02/26/2018, 08/17/2019   Tdap 07/25/2018   Zoster Recombinat (Shingrix) 12/14/2017, 02/20/2018, 07/25/2018, 08/25/2018    TDAP status: Up to date  Flu Vaccine status: Up to date  Pneumococcal vaccine status: Up to date  Covid-19 vaccine status: Information provided on how to obtain vaccines.   Qualifies for Shingles Vaccine? Yes   Zostavax completed No   Shingrix Completed?: Yes  Screening Tests Health Maintenance  Topic Date Due   OPHTHALMOLOGY EXAM  Never done   Hepatitis C Screening  Never done   Diabetic kidney evaluation - Urine ACR  03/06/2019   COVID-19 Vaccine (4 - 2023-24 season) 03/25/2022   INFLUENZA VACCINE  10/23/2022 (Originally 02/22/2022)   HEMOGLOBIN A1C  10/10/2022   Diabetic kidney evaluation - GFR measurement  04/12/2023   FOOT EXAM  04/12/2023   Medicare Annual Wellness (AWV)  06/28/2023   MAMMOGRAM  08/31/2023   DTaP/Tdap/Td (2 - Td or Tdap) 07/25/2028   COLONOSCOPY (Pts 45-33yr Insurance coverage will need to be confirmed)  01/12/2029   Pneumonia Vaccine 71 Years old  Completed   DEXA SCAN  Completed   Zoster Vaccines- Shingrix  Completed   HPV VACCINES  Aged Out    Health Maintenance  Health Maintenance Due  Topic Date Due   OPHTHALMOLOGY EXAM  Never done   Hepatitis C Screening  Never  done   Diabetic kidney evaluation - Urine ACR  03/06/2019   COVID-19 Vaccine (4 - 2023-24 season) 03/25/2022    Colorectal cancer screening: Type of screening: Colonoscopy. Completed 01/12/22. Repeat every 7 years  Mammogram status: Completed 08/30/21. Repeat every year  Bone Density status: Completed 03/15/17. Results reflect: Bone density results: NORMAL. Repeat every as directed  years.  Lung Cancer Screening: (Low Dose CT Chest recommended if Age 71-80years, 30 pack-year currently smoking OR have quit w/in 15years.) does not qualify.   Lung Cancer Screening Referral: n/a  Additional Screening:  Hepatitis C Screening: does qualify; Completed At next office visit   Vision Screening: Recommended annual ophthalmology exams for early detection of glaucoma and other disorders of the eye. Is the patient up to date with their annual eye exam?  Yes  Who is the provider or what is the name of the office in which the patient attends annual eye exams? Dr. JWynetta Emery If pt is not established with a provider, would they like to be referred to a provider to establish care?  N/a  .   Dental Screening: Recommended annual dental exams for proper oral hygiene  [employer]  Referral / Chronic Care Management: CRR required this visit?  No   CCM required this visit?  No      Plan:     I have personally reviewed and noted the following in the patient's chart:   Medical and social history Use of alcohol, tobacco or illicit drugs  Current medications and supplements including opioid prescriptions. Patient is not currently taking opioid prescriptions. Functional ability and status Nutritional status Physical activity Advanced directives List of other physicians Hospitalizations, surgeries, and ER visits in previous 12 months Vitals Screenings to include cognitive, depression, and falls Referrals and appointments  In addition, I have reviewed and discussed with patient certain preventive  protocols, quality metrics, and best practice recommendations. A written personalized care plan for preventive services as well as general preventive health recommendations were provided to patient.     Vanetta Mulders, LPN   03/0/1314   Due to this being a virtual visit, the after visit summary with patients personalized plan was offered to patient via mail or my-chart. Per request, patient was mailed a copy of AVS  Nurse Notes: No concerns

## 2022-06-27 NOTE — Patient Instructions (Signed)
Tammy Hudson , Thank you for taking time to come for your Medicare Wellness Visit. I appreciate your ongoing commitment to your health goals. Please review the following plan we discussed and let me know if I can assist you in the future.   These are the goals we discussed:  Goals      Patient Stated     Maintain independence          This is a list of the screening recommended for you and due dates:  Health Maintenance  Topic Date Due   Eye exam for diabetics  Never done   Hepatitis C Screening: USPSTF Recommendation to screen - Ages 68-79 yo.  Never done   Yearly kidney health urinalysis for diabetes  03/06/2019   COVID-19 Vaccine (4 - 2023-24 season) 03/25/2022   Flu Shot  10/23/2022*   Hemoglobin A1C  10/10/2022   Yearly kidney function blood test for diabetes  04/12/2023   Complete foot exam   04/12/2023   Medicare Annual Wellness Visit  06/28/2023   Mammogram  08/31/2023   DTaP/Tdap/Td vaccine (2 - Td or Tdap) 07/25/2028   Colon Cancer Screening  01/12/2029   Pneumonia Vaccine  Completed   DEXA scan (bone density measurement)  Completed   Zoster (Shingles) Vaccine  Completed   HPV Vaccine  Aged Out  *Topic was postponed. The date shown is not the original due date.    Advanced directives: Advance directive discussed with you today. I have provided a copy for you to complete at home and have notarized. Once this is complete please bring a copy in to our office so we can scan it into your chart.   Conditions/risks identified: Aim for 30 minutes of exercise or brisk walking, 6-8 glasses of water, and 5 servings of fruits and vegetables each day.   Next appointment: Follow up in one year for your annual wellness visit    Preventive Care 71 Years and Older, Female Preventive care refers to lifestyle choices and visits with your health care provider that can promote health and wellness. What does preventive care include? A yearly physical exam. This is also called an annual  well check. Dental exams once or twice a year. Routine eye exams. Ask your health care provider how often you should have your eyes checked. Personal lifestyle choices, including: Daily care of your teeth and gums. Regular physical activity. Eating a healthy diet. Avoiding tobacco and drug use. Limiting alcohol use. Practicing safe sex. Taking low-dose aspirin every day. Taking vitamin and mineral supplements as recommended by your health care provider. What happens during an annual well check? The services and screenings done by your health care provider during your annual well check will depend on your age, overall health, lifestyle risk factors, and family history of disease. Counseling  Your health care provider may ask you questions about your: Alcohol use. Tobacco use. Drug use. Emotional well-being. Home and relationship well-being. Sexual activity. Eating habits. History of falls. Memory and ability to understand (cognition). Work and work Statistician. Reproductive health. Screening  You may have the following tests or measurements: Height, weight, and BMI. Blood pressure. Lipid and cholesterol levels. These may be checked every 5 years, or more frequently if you are over 61 years old. Skin check. Lung cancer screening. You may have this screening every year starting at age 71 if you have a 30-pack-year history of smoking and currently smoke or have quit within the past 15 years. Fecal occult blood test (FOBT)  of the stool. You may have this test every year starting at age 71. Flexible sigmoidoscopy or colonoscopy. You may have a sigmoidoscopy every 5 years or a colonoscopy every 10 years starting at age 71. Hepatitis C blood test. Hepatitis B blood test. Sexually transmitted disease (STD) testing. Diabetes screening. This is done by checking your blood sugar (glucose) after you have not eaten for a while (fasting). You may have this done every 1-3 years. Bone density  scan. This is done to screen for osteoporosis. You may have this done starting at age 71. Mammogram. This may be done every 1-2 years. Talk to your health care provider about how often you should have regular mammograms. Talk with your health care provider about your test results, treatment options, and if necessary, the need for more tests. Vaccines  Your health care provider may recommend certain vaccines, such as: Influenza vaccine. This is recommended every year. Tetanus, diphtheria, and acellular pertussis (Tdap, Td) vaccine. You may need a Td booster every 10 years. Zoster vaccine. You may need this after age 71. Pneumococcal 13-valent conjugate (PCV13) vaccine. One dose is recommended after age 71. Pneumococcal polysaccharide (PPSV23) vaccine. One dose is recommended after age 71. Talk to your health care provider about which screenings and vaccines you need and how often you need them. This information is not intended to replace advice given to you by your health care provider. Make sure you discuss any questions you have with your health care provider. Document Released: 08/07/2015 Document Revised: 03/30/2016 Document Reviewed: 05/12/2015 Elsevier Interactive Patient Education  2017 [place] Prevention in the Home Falls can cause injuries. They can happen to people of all ages. There are many things you can do to make your home safe and to help prevent falls. What can I do on the outside of my home? Regularly fix the edges of walkways and driveways and fix any cracks. Remove anything that might make you trip as you walk through a door, such as a raised step or threshold. Trim any bushes or trees on the path to your home. Use bright outdoor lighting. Clear any walking paths of anything that might make someone trip, such as rocks or tools. Regularly check to see if handrails are loose or broken. Make sure that both sides of any steps have handrails. Any raised decks and  porches should have guardrails on the edges. Have any leaves, snow, or ice cleared regularly. Use sand or salt on walking paths during winter. Clean up any spills in your garage right away. This includes oil or grease spills. What can I do in the bathroom? Use night lights. Install grab bars by the toilet and in the tub and shower. Do not use towel bars as grab bars. Use non-skid mats or decals in the tub or shower. If you need to sit down in the shower, use a plastic, non-slip stool. Keep the floor dry. Clean up any water that spills on the floor as soon as it happens. Remove soap buildup in the tub or shower regularly. Attach bath mats securely with double-sided non-slip rug tape. Do not have throw rugs and other things on the floor that can make you trip. What can I do in the bedroom? Use night lights. Make sure that you have a light by your bed that is easy to reach. Do not use any sheets or blankets that are too big for your bed. They should not hang down onto the floor. Have a firm  chair that has side arms. You can use this for support while you get dressed. Do not have throw rugs and other things on the floor that can make you trip. What can I do in the kitchen? Clean up any spills right away. Avoid walking on wet floors. Keep items that you use a lot in easy-to-reach places. If you need to reach something above you, use a strong step stool that has a grab bar. Keep electrical cords out of the way. Do not use floor polish or wax that makes floors slippery. If you must use wax, use non-skid floor wax. Do not have throw rugs and other things on the floor that can make you trip. What can I do with my stairs? Do not leave any items on the stairs. Make sure that there are handrails on both sides of the stairs and use them. Fix handrails that are broken or loose. Make sure that handrails are as long as the stairways. Check any carpeting to make sure that it is firmly attached to the  stairs. Fix any carpet that is loose or worn. Avoid having throw rugs at the top or bottom of the stairs. If you do have throw rugs, attach them to the floor with carpet tape. Make sure that you have a light switch at the top of the stairs and the bottom of the stairs. If you do not have them, ask someone to add them for you. What else can I do to help prevent falls? Wear shoes that: Do not have high heels. Have rubber bottoms. Are comfortable and fit you well. Are closed at the toe. Do not wear sandals. If you use a stepladder: Make sure that it is fully opened. Do not climb a closed stepladder. Make sure that both sides of the stepladder are locked into place. Ask someone to hold it for you, if possible. Clearly mark and make sure that you can see: Any grab bars or handrails. First and last steps. Where the edge of each step is. Use tools that help you move around (mobility aids) if they are needed. These include: Canes. Walkers. Scooters. Crutches. Turn on the lights when you go into a dark area. Replace any light bulbs as soon as they burn out. Set up your furniture so you have a clear path. Avoid moving your furniture around. If any of your floors are uneven, fix them. If there are any pets around you, be aware of where they are. Review your medicines with your doctor. Some medicines can make you feel dizzy. This can increase your chance of falling. Ask your doctor what other things that you can do to help prevent falls. This information is not intended to replace advice given to you by your health care provider. Make sure you discuss any questions you have with your health care provider. Document Released: 05/07/2009 Document Revised: 12/17/2015 Document Reviewed: 08/15/2014 Elsevier Interactive Patient Education  2017 [employer].

## 2022-07-05 ENCOUNTER — Other Ambulatory Visit: Payer: Self-pay | Admitting: Nurse Practitioner

## 2022-07-11 ENCOUNTER — Ambulatory Visit: Payer: Medicare Other | Admitting: Nurse Practitioner

## 2022-07-12 ENCOUNTER — Telehealth: Payer: Self-pay | Admitting: Nurse Practitioner

## 2022-07-12 NOTE — Telephone Encounter (Signed)
Attempted to contact - NA 

## 2022-07-12 NOTE — Telephone Encounter (Signed)
Pt called stating that she was returning Kelci's call. Wants Kelci to call her back.

## 2022-07-13 NOTE — Telephone Encounter (Signed)
Pt states she had infusion yesterday and her BP was elevated so they had wanted her to monitor it and call her PCP. Her BP has now returned to normal and she has an appt scheduled with her PCP 07/15/22. Advised pt to continue to monitor and to call back with any further concerns prior to her appt and pt voiced understanding.

## 2022-07-13 NOTE — Telephone Encounter (Signed)
PT R/C 

## 2022-07-15 ENCOUNTER — Encounter: Payer: Self-pay | Admitting: Nurse Practitioner

## 2022-07-15 ENCOUNTER — Ambulatory Visit (INDEPENDENT_AMBULATORY_CARE_PROVIDER_SITE_OTHER): Payer: Medicare Other | Admitting: Nurse Practitioner

## 2022-07-15 VITALS — BP 169/79 | HR 94 | Temp 97.4°F | Resp 20 | Ht 67.0 in | Wt 233.0 lb

## 2022-07-15 DIAGNOSIS — E782 Mixed hyperlipidemia: Secondary | ICD-10-CM | POA: Diagnosis not present

## 2022-07-15 DIAGNOSIS — E559 Vitamin D deficiency, unspecified: Secondary | ICD-10-CM

## 2022-07-15 DIAGNOSIS — I10 Essential (primary) hypertension: Secondary | ICD-10-CM | POA: Diagnosis not present

## 2022-07-15 DIAGNOSIS — E1142 Type 2 diabetes mellitus with diabetic polyneuropathy: Secondary | ICD-10-CM

## 2022-07-15 LAB — BAYER DCA HB A1C WAIVED: HB A1C (BAYER DCA - WAIVED): 8.1 % — ABNORMAL HIGH (ref 4.8–5.6)

## 2022-07-15 MED ORDER — AMLODIPINE BESYLATE 5 MG PO TABS: 5.0000 mg | ORAL_TABLET | Freq: Every day | ORAL | 1 refills | Status: DC

## 2022-07-15 NOTE — Progress Notes (Signed)
Subjective:    Patient ID: Tammy Hudson, female    DOB: [birth year], 71 y.o.   MRN: [id number]   Chief Complaint: medical management of chronic issues     HPI:  Tammy Hudson is a 71 y.o. who identifies as a female who was assigned female at birth.   Social history: Lives with: her husband Work history: retired   Scientist, forensic in today for follow up of the following chronic medical issues:  1. Essential hypertension No c/o chest pain, sob or headache. Does not check blood pressure at home. BP Readings from Last 3 Encounters:  04/11/22 (!) 170/82  01/28/22 (!) 142/92  01/12/22 (!) 154/82     2. Mixed hyperlipidemia Does not watch diet and does no dedicated exercise. Lab Results  Component Value Date   CHOL 206 (H) 04/11/2022   HDL 60 04/11/2022   LDLCALC 105 (H) 04/11/2022   TRIG 239 (H) 04/11/2022   CHOLHDL 3.4 04/11/2022     3. Type 2 diabetes mellitus with peripheral neuropathy (HCC) She does not check her blood sugars at home. Lab Results  Component Value Date   HGBA1C 6.8 (H) 04/11/2022     4. Vitamin D deficiency Last vitamin D Lab Results  Component Value Date   VD25OH 11.9 (L) 04/03/2015         New complaints: None today  No Known Allergies Outpatient Encounter Medications as of 07/15/2022  Medication Sig   albuterol (PROVENTIL) (2.5 MG/3ML) 0.083% nebulizer solution Inhale 3 mLs into the lungs every 4 (four) hours as needed for shortness of breath.   ALPRAZolam (XANAX) 0.25 MG tablet TAKE 1 TABLET BY MOUTH TWICE DAILY AS NEEDED   Cinnamon 500 MG capsule Take 500 mg by mouth daily.   cyclobenzaprine (FLEXERIL) 10 MG tablet Take one tablet (10 mg dose) by mouth at bedtime.   DULoxetine (CYMBALTA) 30 MG capsule Take 1 capsule (30 mg total) by mouth daily as needed (RA pain).   escitalopram (LEXAPRO) 20 MG tablet Take 1 tablet (20 mg total) by mouth daily as needed (depression).   fexofenadine (ALLEGRA) 180 MG tablet Take 180 mg by mouth daily.    fluticasone-salmeterol (ADVAIR HFA) 115-21 MCG/ACT inhaler Inhale 2 puffs into the lungs 2 (two) times daily.   folic acid (FOLVITE) 350 MCG tablet Take 800 mcg by mouth daily.   losartan (COZAAR) 100 MG tablet Take 1 tablet (100 mg total) by mouth daily.   methotrexate (RHEUMATREX) 2.5 MG tablet Take 15 mg by mouth every Tuesday.   montelukast (SINGULAIR) 10 MG tablet Take 10 mg by mouth at bedtime.   simvastatin (ZOCOR) 20 MG tablet Take 1 tablet (20 mg total) by mouth at bedtime.   zolpidem (AMBIEN) 10 MG tablet Take 10 mg by mouth at bedtime as needed for sleep.   No facility-administered encounter medications on file as of 07/15/2022.    Past Surgical History:  Procedure Laterality Date   ABDOMINAL HYSTERECTOMY     COLONOSCOPY WITH PROPOFOL N/A 01/12/2022   Procedure: COLONOSCOPY WITH PROPOFOL;  Surgeon: Harvel Quale, MD;  Location: [facility];  Service: Gastroenterology;  Laterality: N/A;  220 pre op done already   FLEXIBLE SIGMOIDOSCOPY  01/11/2022   Procedure: FLEXIBLE SIGMOIDOSCOPY;  Surgeon: Harvel Quale, MD;  Location: [facility];  Service: Gastroenterology;;   POLYPECTOMY  01/12/2022   Procedure: POLYPECTOMY INTESTINAL;  Surgeon: Harvel Quale, MD;  Location: [facility];  Service: Gastroenterology;;    No family history on file.  Controlled substance contract: n/a     Review of Systems  Constitutional:  Negative for diaphoresis.  Eyes:  Negative for pain.  Respiratory:  Negative for shortness of breath.   Cardiovascular:  Negative for chest pain, palpitations and leg swelling.  Gastrointestinal:  Negative for abdominal pain.  Endocrine: Negative for polydipsia.  Skin:  Negative for rash.  Neurological:  Negative for dizziness, weakness and headaches.  Hematological:  Does not bruise/bleed easily.  All other systems reviewed and are negative.      Objective:   Physical Exam Vitals and nursing note reviewed.   Constitutional:      General: She is not in acute distress.    Appearance: Normal appearance. She is well-developed.  HENT:     Head: Normocephalic.     Right Ear: Tympanic membrane normal.     Left Ear: Tympanic membrane normal.     Nose: Nose normal.     Mouth/Throat:     Mouth: Mucous membranes are moist.  Eyes:     Pupils: Pupils are equal, round, and reactive to light.  Neck:     Vascular: No carotid bruit or JVD.  Cardiovascular:     Rate and Rhythm: Normal rate and regular rhythm.     Heart sounds: Normal heart sounds.  Pulmonary:     Effort: Pulmonary effort is normal. No respiratory distress.     Breath sounds: Normal breath sounds. No wheezing or rales.  Chest:     Chest wall: No tenderness.  Abdominal:     General: Bowel sounds are normal. There is no distension or abdominal bruit.     Palpations: Abdomen is soft. There is no hepatomegaly, splenomegaly, mass or pulsatile mass.     Tenderness: There is no abdominal tenderness.  Musculoskeletal:        General: Normal range of motion.     Cervical back: Normal range of motion and neck supple.  Lymphadenopathy:     Cervical: No cervical adenopathy.  Skin:    General: Skin is warm and dry.  Neurological:     Mental Status: She is alert and oriented to person, place, and time.     Deep Tendon Reflexes: Reflexes are normal and symmetric.  Psychiatric:        Behavior: Behavior normal.        Thought Content: Thought content normal.        Judgment: Judgment normal.    BP (!) 169/79   Pulse 94   Temp (!) 97.4 F (36.3 C) (Temporal)   Resp 20   Ht _0  (1.702 m)   Wt 233 lb (105.7 kg)   SpO2 97%   BMI 36.49 kg/m         Assessment & Plan:   Tammy Hudson comes in today with chief complaint of Medical Management of Chronic Issues (Spot on back of left leg//)   Diagnosis and orders addressed:  1. Essential hypertension Low sodium diet Added amlodipine to meds Keep diary of blood pressure at home  if able - CBC with Differential/Platelet - CMP14+EGFR - amLODipine (NORVASC) 5 MG tablet; Take 1 tablet (5 mg total) by mouth daily.  Dispense: 90 tablet; Refill: 1  2. Mixed hyperlipidemia Low fat diet - Lipid panel  3. Type 2 diabetes mellitus with peripheral neuropathy (HCC) Continue to watch carbs in diet - Bayer DCA Hb A1c Waived - Microalbumin / creatinine urine ratio  4. Vitamin D deficiency Continue vitamin d supplement - VITAMIN D 25 Hydroxy (  Vit-D Deficiency, Fractures)   Labs pending Health Maintenance reviewed Diet and exercise encouraged  Follow up plan: 3 months   Mary-Margaret Hassell Done, FNP

## 2022-07-15 NOTE — Patient Instructions (Signed)

## 2022-07-16 LAB — CMP14+EGFR
ALT: 12 IU/L (ref 0–32)
AST: 19 IU/L (ref 0–40)
Albumin/Globulin Ratio: 1.7 (ref 1.2–2.2)
Albumin: 4 g/dL (ref 3.8–4.8)
Alkaline Phosphatase: 77 IU/L (ref 44–121)
BUN/Creatinine Ratio: 12 (ref 12–28)
BUN: 15 mg/dL (ref 8–27)
Bilirubin Total: 0.6 mg/dL (ref 0.0–1.2)
CO2: 22 mmol/L (ref 20–29)
Calcium: 9.1 mg/dL (ref 8.7–10.3)
Chloride: 103 mmol/L (ref 96–106)
Creatinine, Ser: 1.27 mg/dL — ABNORMAL HIGH (ref 0.57–1.00)
Globulin, Total: 2.4 g/dL (ref 1.5–4.5)
Glucose: 110 mg/dL — ABNORMAL HIGH (ref 70–99)
Potassium: 4.3 mmol/L (ref 3.5–5.2)
Sodium: 142 mmol/L (ref 134–144)
Total Protein: 6.4 g/dL (ref 6.0–8.5)
eGFR: 45 mL/min/{1.73_m2} — ABNORMAL LOW (ref >59)

## 2022-07-16 LAB — CBC WITH DIFFERENTIAL/PLATELET
Basophils Absolute: 0 10*3/uL (ref 0.0–0.2)
Basos: 1 %
EOS (ABSOLUTE): 0.3 10*3/uL (ref 0.0–0.4)
Eos: 7 %
Hematocrit: 39.1 % (ref 34.0–46.6)
Hemoglobin: 12.4 g/dL (ref 11.1–15.9)
Immature Grans (Abs): 0 10*3/uL (ref 0.0–0.1)
Immature Granulocytes: 0 %
Lymphocytes Absolute: 1.2 10*3/uL (ref 0.7–3.1)
Lymphs: 27 %
MCH: 29.8 pg (ref 26.6–33.0)
MCHC: 31.7 g/dL (ref 31.5–35.7)
MCV: 94 fL (ref 79–97)
Monocytes Absolute: 0.2 10*3/uL (ref 0.1–0.9)
Monocytes: 5 %
Neutrophils Absolute: 2.7 10*3/uL (ref 1.4–7.0)
Neutrophils: 60 %
Platelets: 245 10*3/uL (ref 150–450)
RBC: 4.16 x10E6/uL (ref 3.77–5.28)
RDW: 13.3 % (ref 11.7–15.4)
WBC: 4.4 10*3/uL (ref 3.4–10.8)

## 2022-07-16 LAB — LIPID PANEL
Chol/HDL Ratio: 3.6 ratio (ref 0.0–4.4)
Cholesterol, Total: 175 mg/dL (ref 100–199)
HDL: 48 mg/dL (ref >39)
LDL Chol Calc (NIH): 99 mg/dL (ref 0–99)
Triglycerides: 161 mg/dL — ABNORMAL HIGH (ref 0–149)
VLDL Cholesterol Cal: 28 mg/dL (ref 5–40)

## 2022-07-16 LAB — MICROALBUMIN / CREATININE URINE RATIO
Creatinine, Urine: 113.2 mg/dL
Microalb/Creat Ratio: 4 mg/g creat (ref 0–29)
Microalbumin, Urine: 4.8 ug/mL

## 2022-07-16 LAB — VITAMIN D 25 HYDROXY (VIT D DEFICIENCY, FRACTURES): Vit D, 25-Hydroxy: 28.1 ng/mL — ABNORMAL LOW (ref 30.0–100.0)

## 2022-07-22 ENCOUNTER — Other Ambulatory Visit (HOSPITAL_COMMUNITY): Payer: Self-pay | Admitting: Nurse Practitioner

## 2022-07-22 ENCOUNTER — Other Ambulatory Visit: Payer: Self-pay | Admitting: Nurse Practitioner

## 2022-07-22 DIAGNOSIS — Z1231 Encounter for screening mammogram for malignant neoplasm of breast: Secondary | ICD-10-CM

## 2022-08-01 ENCOUNTER — Telehealth: Payer: Self-pay

## 2022-08-01 NOTE — Telephone Encounter (Signed)
Patient is concerned about last Hgb A1c. She would like to start on some medications but doesn't want to do metformin

## 2022-08-02 MED ORDER — SITAGLIPTIN PHOSPHATE 100 MG PO TABS: 100.0000 mg | ORAL_TABLET | Freq: Every day | ORAL | 1 refills | Status: DC

## 2022-08-02 NOTE — Telephone Encounter (Signed)
Will try [place]- needs to follow up in 1 month and recheck hgba1c  Meds ordered this encounter  Medications   sitaGLIPtin (JANUVIA) 100 MG tablet    Sig: Take 1 tablet (100 mg total) by mouth daily.    Dispense:  90 tablet    Refill:  1    Order Specific Question:   Supervising Provider    Answer:   Worthy Rancher [[id number]]   [place], FNP

## 2022-08-02 NOTE — Telephone Encounter (Signed)
Patient notified and verbalized understanding. 

## 2022-08-22 ENCOUNTER — Encounter: Payer: Self-pay | Admitting: Nurse Practitioner

## 2022-08-22 ENCOUNTER — Other Ambulatory Visit: Payer: Self-pay | Admitting: Nurse Practitioner

## 2022-08-22 ENCOUNTER — Ambulatory Visit (INDEPENDENT_AMBULATORY_CARE_PROVIDER_SITE_OTHER): Payer: Medicare Other | Admitting: Nurse Practitioner

## 2022-08-22 VITALS — BP 139/76 | HR 87 | Temp 98.0°F | Ht 67.0 in | Wt 236.8 lb

## 2022-08-22 DIAGNOSIS — M542 Cervicalgia: Secondary | ICD-10-CM

## 2022-08-22 MED ORDER — DICLOFENAC SODIUM 1 % EX GEL
4.0000 g | Freq: Four times a day (QID) | CUTANEOUS | 1 refills | Status: DC
Start: 2022-08-22 — End: 2023-04-20

## 2022-08-22 MED ORDER — CYCLOBENZAPRINE HCL 5 MG PO TABS: 5.0000 mg | ORAL_TABLET | Freq: Three times a day (TID) | ORAL | 1 refills | Status: DC | PRN

## 2022-08-22 NOTE — Patient Instructions (Signed)
Cervical Sprain A cervical sprain is also called a neck sprain. It is a stretch or tear in one or more ligaments in the neck. Ligaments are tissues that connect bones to each other. Neck sprains can be mild, bad, or very bad. A very bad sprain in the neck can cause the bones in the neck to be unstable. This can damage the spinal cord. It can also cause serious problems in the brain, spinal cord, and nerves (nervous system). Most neck sprains heal in 4-6 weeks. It can take more or less time depending on: What caused the injury. The amount of injury. What are the causes? Neck sprains may be caused by trauma, such as: An injury from an accident in a vehicle such as a car or boat. A fall. The head and neck being moved front to back or side to side all of a sudden (whiplash injury). Mild neck sprains may be caused by wear and tear over time. What increases the risk? The following factors may make you more likely to develop this condition: Taking part in activities that put you at high risk of hurting your neck. These include: Contact sports. Car racing. Gymnastics. Diving. Taking risks when driving or riding in a vehicle such as a car or boat. Arthritis caused by wear and tear of the joints in the spine. The neck not being very strong or flexible. Having had a neck injury in the past. Poor posture. Spending a lot of time in certain positions that put stress on the neck. This may be from sitting at a computer for a long time. What are the signs or symptoms? Symptoms of this condition include: Your neck, shoulders, or upper back feeling: Painful or sore. Stiff. Tender. Swollen. Hot, or like it is burning. Sudden tightening of neck muscles (spasms). Not being able to move the neck very much. Headache. Feeling dizzy. Feeling like you may vomit, or vomiting. Having a hand or arm that: Feels weak. Loses feeling (feels numb). Tingles. You may get symptoms right away after injury, or you  may get them over a few days. In some cases, symptoms may go away with treatment and come back over time. How is this treated? This condition is treated by: Resting your neck. Icing the part of your neck that is hurt. Doing exercises to restore movement and strength to your neck (physical therapy). If there is no swelling, you may use heat therapy 2-3 days after the injury took place. If your injury is very bad, treatment may also include: Keeping your neck in place for a length of time. This may be done using: A neck collar. This supports your chin and the back of your head. A cervical traction device. This is a sling that holds up your head. The sling removes weight and pressure from your neck. It may also help to relieve pain. Medicines that help with: Pain. Irritation and swelling (inflammation). Medicines that help to relax your muscles (muscle relaxants). Surgery. This is rare. Follow these instructions at home: Medicines  Take over-the-counter and prescription medicines only as told by your doctor. Ask your doctor if the medicine prescribed to you: Requires you to avoid driving or using heavy machinery. Can cause trouble pooping (constipation). You may need to take these actions to prevent or treat trouble pooping: Drink enough fluid to keep your pee (urine) pale yellow. Take over-the-counter or prescription medicines. Eat foods that are high in fiber. These include beans, whole grains, and fresh fruits and vegetables. Limit   foods that are high in fat and sugar. These include fried or sweet foods. If you have a neck collar: Wear it as told by your doctor. Do not take it off unless told. Ask your doctor before adjusting your collar. If you have long hair, keep it outside of the collar. Ask your doctor if you may take off the collar for cleaning and bathing. If you may take off the collar: Follow instructions about how to take it off safely. Clean it by hand with mild soap and  water. Let it air-dry fully. If your collar has pads that you can take out: Take the pads out every 1-2 days. Wash them by hand with soap and water. Let the pads air-dry fully before you put them back in the collar. Tell your doctor if your skin under the collar has irritation or sores. Managing pain, stiffness, and swelling     Use a cervical traction device, if told by your doctor. If told, put ice on the affected area. To do this: Put ice in a plastic bag. Place a towel between your skin and the bag. Leave the ice on for 20 minutes, 2-3 times a day. If told, put heat on the affected area. Do this before exercise or as often as told by your doctor. Use the heat source that your doctor recommends, such as a moist heat pack or a heating pad. Place a towel between your skin and the heat source. Leave the heat on for 20-30 minutes. Take the heat off if your skin turns bright red. This is very important if you cannot feel pain, heat, or cold. You may have a greater risk of getting burned. Activity Do not drive while wearing a neck collar. If you do not have a neck collar, ask if it is safe to drive while your neck heals. Do not lift anything that is heavier than 10 lb (4.5 kg), or the limit that you are told, until your doctor tells you that it is safe. Rest as told by your doctor. Do exercises as told by your doctor or physical therapist. Return to your normal activities as told by your doctor. Avoid positions and activities that make you feel worse. Ask your doctor what activities are safe for you. General instructions Do not use any products that contain nicotine or tobacco, such as cigarettes, e-cigarettes, and chewing tobacco. These can delay healing. If you need help quitting, ask your doctor. Keep all follow-up visits as told by your doctor or physical therapist. This is important. How is this prevented? To prevent a neck sprain from happening again: Practice good posture. Adjust  your workstation to help you do this. Exercise regularly as told by your doctor or physical therapist. Avoid activities that are risky or may cause a neck sprain. Contact a doctor if: Your symptoms get worse. Your symptoms do not get better after 2 weeks of treatment. Your pain gets worse. Medicine does not help your pain. You have new symptoms that you cannot explain. Your neck collar gives you sores on your skin or bothers your skin. Get help right away if: You have very bad pain. You get any of the following in any part of your body: Loss of feeling. Tingling. Weakness. You cannot move a part of your body. You have neck pain and either of these: Very bad dizziness. A very bad headache. Summary A cervical sprain is also called a neck sprain. It is a stretch or tear in one or more   ligaments in the neck. Ligaments are tissues that connect bones. Neck sprains may be caused by trauma, such as an injury or a fall. You may get symptoms right away after injury, or you may get them over a few days. Neck sprains may be treated with rest, heat, ice, medicines, exercise, and surgery. This information is not intended to replace advice given to you by your health care provider. Make sure you discuss any questions you have with your health care provider. Document Revised: 10/18/2021 Document Reviewed: 03/20/2019 Elsevier Patient Education  2023 [employer]  

## 2022-08-22 NOTE — Progress Notes (Signed)
   Subjective:    Patient ID: Tammy Hudson, female    DOB: 1951-01-18, 72 y.o.   MRN: [id number]   Chief Complaint: Headache (Head pain, back of head/neck - tension x 3d)   Neck Pain  This is a new problem. The current episode started in the past 7 days. The problem occurs intermittently. The problem has been waxing and waning. The pain is associated with nothing. The quality of the pain is described as aching. The pain is at a severity of 7/10. The pain is moderate. Nothing aggravates the symptoms. The pain is Same all the time. Pertinent negatives include no fever, headaches or weakness. She has tried muscle relaxants for the symptoms. The treatment provided mild relief.   Patient comes in today c/o neck pain    Review of Systems  Constitutional:  Negative for chills, fatigue and fever.  Musculoskeletal:  Positive for neck pain.  Neurological:  Negative for weakness and headaches.       Objective:   Physical Exam Vitals reviewed.  Constitutional:      Appearance: She is well-developed.  Cardiovascular:     Rate and Rhythm: Normal rate.     Heart sounds: Normal heart sounds.  Pulmonary:     Effort: Pulmonary effort is normal.     Breath sounds: Normal breath sounds.  Musculoskeletal:     Cervical back: Normal range of motion and neck supple.     Comments: FROM of cervical spine with pain on tilting to the left Pain on palpation posterior right side of neck. Grips equal bil.  Neurological:     General: No focal deficit present.     Mental Status: She is alert and oriented to person, place, and time.  Psychiatric:        Mood and Affect: Mood normal.        Behavior: Behavior normal.     BP 139/76   Pulse 87   Temp 98 F (36.7 C) (Oral)   Ht '5\' 7"'$  (1.702 m)   Wt 236 lb 12.8 oz (107.4 kg)   SpO2 95%   BMI 37.09 kg/m        Assessment & Plan:  Tammy Hudson in today with chief complaint of Headache (Head pain, back of head/neck - tension x 3d)   1. Neck  pain Moist heat Neck stretches RTO prn - diclofenac Sodium (VOLTAREN) 1 % GEL; Apply 4 g topically 4 (four) times daily.  Dispense: 350 g; Refill: 1 - cyclobenzaprine (FLEXERIL) 5 MG tablet; Take 1 tablet (5 mg total) by mouth 3 (three) times daily as needed for muscle spasms.  Dispense: 30 tablet; Refill: 1    The above assessment and management plan was discussed with the patient. The patient verbalized understanding of and has agreed to the management plan. Patient is aware to call the clinic if symptoms persist or worsen. Patient is aware when to return to the clinic for a follow-up visit. Patient educated on when it is appropriate to go to the emergency department.   Mary-Margaret Hassell Done, FNP

## 2022-08-25 ENCOUNTER — Ambulatory Visit: Payer: Medicare Other | Admitting: Internal Medicine

## 2022-09-01 ENCOUNTER — Telehealth: Payer: Self-pay | Admitting: Nurse Practitioner

## 2022-09-01 ENCOUNTER — Ambulatory Visit (HOSPITAL_COMMUNITY)
Admission: RE | Admit: 2022-09-01 | Discharge: 2022-09-01 | Disposition: A | Discharge status: Home / Self Care | Payer: Medicare Other | Source: Ambulatory Visit | Attending: Nurse Practitioner | Admitting: Nurse Practitioner

## 2022-09-01 DIAGNOSIS — Z1231 Encounter for screening mammogram for malignant neoplasm of breast: Secondary | ICD-10-CM

## 2022-09-01 NOTE — Telephone Encounter (Signed)
PATIENT AWARE A1C WILL BE REPEATED AT TIME OF OV NEXT MONTH

## 2022-10-04 ENCOUNTER — Other Ambulatory Visit: Payer: Self-pay | Admitting: *Deleted

## 2022-10-04 MED ORDER — SITAGLIPTIN PHOSPHATE 100 MG PO TABS: 100.0000 mg | ORAL_TABLET | Freq: Every day | ORAL | 0 refills | Status: DC

## 2022-10-07 ENCOUNTER — Other Ambulatory Visit: Payer: Self-pay | Admitting: Nurse Practitioner

## 2022-10-07 DIAGNOSIS — I10 Essential (primary) hypertension: Secondary | ICD-10-CM

## 2022-10-17 ENCOUNTER — Encounter: Payer: Self-pay | Admitting: Nurse Practitioner

## 2022-10-17 ENCOUNTER — Ambulatory Visit (INDEPENDENT_AMBULATORY_CARE_PROVIDER_SITE_OTHER): Payer: Medicare Other | Admitting: Nurse Practitioner

## 2022-10-17 VITALS — BP 131/79 | HR 84 | Temp 97.9°F | Resp 20 | Ht 67.0 in | Wt 231.0 lb

## 2022-10-17 DIAGNOSIS — E559 Vitamin D deficiency, unspecified: Secondary | ICD-10-CM | POA: Diagnosis not present

## 2022-10-17 DIAGNOSIS — E1142 Type 2 diabetes mellitus with diabetic polyneuropathy: Secondary | ICD-10-CM | POA: Diagnosis not present

## 2022-10-17 DIAGNOSIS — F5101 Primary insomnia: Secondary | ICD-10-CM

## 2022-10-17 DIAGNOSIS — I10 Essential (primary) hypertension: Secondary | ICD-10-CM | POA: Diagnosis not present

## 2022-10-17 DIAGNOSIS — E785 Hyperlipidemia, unspecified: Secondary | ICD-10-CM

## 2022-10-17 DIAGNOSIS — E1169 Type 2 diabetes mellitus with other specified complication: Secondary | ICD-10-CM

## 2022-10-17 DIAGNOSIS — F411 Generalized anxiety disorder: Secondary | ICD-10-CM

## 2022-10-17 LAB — BAYER DCA HB A1C WAIVED: HB A1C (BAYER DCA - WAIVED): 6.9 % — ABNORMAL HIGH (ref 4.8–5.6)

## 2022-10-17 MED ORDER — SITAGLIPTIN PHOSPHATE 100 MG PO TABS: 100.0000 mg | ORAL_TABLET | Freq: Every day | ORAL | 1 refills | Status: DC

## 2022-10-17 MED ORDER — ALPRAZOLAM 0.25 MG PO TABS: 0.2500 mg | ORAL_TABLET | Freq: Two times a day (BID) | ORAL | 5 refills | Status: DC | PRN

## 2022-10-17 MED ORDER — ZOLPIDEM TARTRATE 10 MG PO TABS: 10.0000 mg | ORAL_TABLET | Freq: Every evening | ORAL | 5 refills | Status: DC | PRN

## 2022-10-17 MED ORDER — SIMVASTATIN 20 MG PO TABS: 20.0000 mg | ORAL_TABLET | Freq: Every day | ORAL | 1 refills | Status: DC

## 2022-10-17 MED ORDER — AMLODIPINE BESYLATE 5 MG PO TABS: 5.0000 mg | ORAL_TABLET | Freq: Every day | ORAL | 1 refills | Status: DC

## 2022-10-17 MED ORDER — ESCITALOPRAM OXALATE 20 MG PO TABS: 20.0000 mg | ORAL_TABLET | Freq: Every day | ORAL | 1 refills | Status: DC | PRN

## 2022-10-17 MED ORDER — LOSARTAN POTASSIUM 100 MG PO TABS: 100.0000 mg | ORAL_TABLET | Freq: Every day | ORAL | 1 refills | Status: DC

## 2022-10-17 NOTE — Progress Notes (Signed)
Subjective:    Patient ID: Tammy Hudson, female    DOB: 02/06/1951, 72 y.o.   MRN: [id number]   Chief Complaint: medical management of chronic issues     HPI:  Tammy Hudson is a 72 y.o. who identifies as a female who was assigned female at birth.   Social history: Lives with: by herself Work history: retired   Scientist, forensic in today for follow up of the following chronic medical issues:  1. Essential hypertension No c/o chest pain, sob or headache. Does not check blood pressure at home. BP Readings from Last 3 Encounters:  08/22/22 139/76  07/15/22 (!) 169/79  04/11/22 (!) 170/82     2. Hyperlipidemia associated with type 2 diabetes mellitus ([place]) Does try to watch diet but does no dedicated exercise. Lab Results  Component Value Date   CHOL 175 07/15/2022   HDL 48 07/15/2022   LDLCALC 99 07/15/2022   TRIG 161 (H) 07/15/2022   CHOLHDL 3.6 07/15/2022     3. Type 2 diabetes mellitus with peripheral neuropathy ([facility]) Is on januvia. Does not check blood sugar at home. Lab Results  Component Value Date   HGBA1C 8.1 (H) 07/15/2022     4. Vitamin D deficiency Is on daily vitamin d supplement. Last vitamin D Lab Results  Component Value Date   VD25OH 28.1 (L) 07/15/2022      New complaints: None today  No Known Allergies Outpatient Encounter Medications as of 10/17/2022  Medication Sig   albuterol (PROVENTIL) (2.5 MG/3ML) 0.083% nebulizer solution Inhale 3 mLs into the lungs every 4 (four) hours as needed for shortness of breath.   ALPRAZolam (XANAX) 0.25 MG tablet TAKE 1 TABLET BY MOUTH TWICE DAILY AS NEEDED   amLODipine (NORVASC) 5 MG tablet Take 1 tablet (5 mg total) by mouth daily.   Cinnamon 500 MG capsule Take 500 mg by mouth daily.   cyclobenzaprine (FLEXERIL) 5 MG tablet Take 1 tablet (5 mg total) by mouth 3 (three) times daily as needed for muscle spasms.   diclofenac Sodium (VOLTAREN) 1 % GEL Apply 4 g topically 4 (four) times daily.   DULoxetine  (CYMBALTA) 30 MG capsule Take 1 capsule (30 mg total) by mouth daily as needed (RA pain).   escitalopram (LEXAPRO) 20 MG tablet Take 1 tablet (20 mg total) by mouth daily as needed (depression).   fexofenadine (ALLEGRA) 180 MG tablet Take 180 mg by mouth daily.   fluticasone-salmeterol (ADVAIR HFA) 115-21 MCG/ACT inhaler Inhale 2 puffs into the lungs 2 (two) times daily.   folic acid (FOLVITE) Q000111Q MCG tablet Take 800 mcg by mouth daily.   losartan (COZAAR) 100 MG tablet TAKE 1 TABLET BY MOUTH ONCE DAILY   methotrexate (RHEUMATREX) 2.5 MG tablet Take 15 mg by mouth every Tuesday.   montelukast (SINGULAIR) 10 MG tablet Take 10 mg by mouth at bedtime.   simvastatin (ZOCOR) 20 MG tablet TAKE ONE TABLET BY MOUTH AT BEDTIME   sitaGLIPtin (JANUVIA) 100 MG tablet Take 1 tablet (100 mg total) by mouth daily.   zolpidem (AMBIEN) 10 MG tablet Take 10 mg by mouth at bedtime as needed for sleep.   No facility-administered encounter medications on file as of 10/17/2022.    Past Surgical History:  Procedure Laterality Date   ABDOMINAL HYSTERECTOMY     COLONOSCOPY WITH PROPOFOL N/A 01/12/2022   Procedure: COLONOSCOPY WITH PROPOFOL;  Surgeon: Harvel Quale, MD;  Location: [facility];  Service: Gastroenterology;  Laterality: N/A;  220 pre op done  already   FLEXIBLE SIGMOIDOSCOPY  01/11/2022   Procedure: FLEXIBLE SIGMOIDOSCOPY;  Surgeon: Harvel Quale, MD;  Location: [facility];  Service: Gastroenterology;;   POLYPECTOMY  01/12/2022   Procedure: POLYPECTOMY INTESTINAL;  Surgeon: Harvel Quale, MD;  Location: [facility];  Service: Gastroenterology;;    No family history on file.    Controlled substance contract: n/a     Review of Systems  Constitutional:  Negative for diaphoresis.  Eyes:  Negative for pain.  Respiratory:  Negative for shortness of breath.   Cardiovascular:  Negative for chest pain, palpitations and leg swelling.  Gastrointestinal:   Negative for abdominal pain.  Endocrine: Negative for polydipsia.  Skin:  Negative for rash.  Neurological:  Negative for dizziness, weakness and headaches.  Hematological:  Does not bruise/bleed easily.  All other systems reviewed and are negative.      Objective:   Physical Exam Vitals and nursing note reviewed.  Constitutional:      General: She is not in acute distress.    Appearance: Normal appearance. She is well-developed.  HENT:     Head: Normocephalic.     Right Ear: Tympanic membrane normal.     Left Ear: Tympanic membrane normal.     Nose: Nose normal.     Mouth/Throat:     Mouth: Mucous membranes are moist.  Eyes:     Pupils: Pupils are equal, round, and reactive to light.  Neck:     Vascular: No carotid bruit or JVD.  Cardiovascular:     Rate and Rhythm: Normal rate and regular rhythm.     Heart sounds: Normal heart sounds.  Pulmonary:     Effort: Pulmonary effort is normal. No respiratory distress.     Breath sounds: Normal breath sounds. No wheezing or rales.  Chest:     Chest wall: No tenderness.  Abdominal:     General: Bowel sounds are normal. There is no distension or abdominal bruit.     Palpations: Abdomen is soft. There is no hepatomegaly, splenomegaly, mass or pulsatile mass.     Tenderness: There is no abdominal tenderness.  Musculoskeletal:        General: Normal range of motion.     Cervical back: Normal range of motion and neck supple.  Lymphadenopathy:     Cervical: No cervical adenopathy.  Skin:    General: Skin is warm and dry.  Neurological:     Mental Status: She is alert and oriented to person, place, and time.     Deep Tendon Reflexes: Reflexes are normal and symmetric.  Psychiatric:        Behavior: Behavior normal.        Thought Content: Thought content normal.        Judgment: Judgment normal.    BP 131/79   Pulse 84   Temp 97.9 F (36.6 C) (Temporal)   Resp 20   Ht 5\' 7"  (1.702 m)   Wt 231 lb (104.8 kg)   SpO2 94%    BMI 36.18 kg/m          Assessment & Plan:   Deyona Weins comes in today with chief complaint of Medical Management of Chronic Issues   Diagnosis and orders addressed:  1. Essential hypertension Low sodium diet - amLODipine (NORVASC) 5 MG tablet; Take 1 tablet (5 mg total) by mouth daily.  Dispense: 90 tablet; Refill: 1 - losartan (COZAAR) 100 MG tablet; Take 1 tablet (100 mg total) by mouth daily.  Dispense:  90 tablet; Refill: 1 - CBC with Differential/Platelet - CMP14+EGFR  2. Hyperlipidemia associated with type 2 diabetes mellitus (HCC) Low fat diet - simvastatin (ZOCOR) 20 MG tablet; Take 1 tablet (20 mg total) by mouth at bedtime.  Dispense: 90 tablet; Refill: 1 - Lipid panel  3. Type 2 diabetes mellitus with peripheral neuropathy ([facility]) Continue to watch carbs in diet - sitaGLIPtin (JANUVIA) 100 MG tablet; Take 1 tablet (100 mg total) by mouth daily.  Dispense: 90 tablet; Refill: 1 - Bayer DCA Hb A1c Waived  4. Vitamin D deficiency Continue daily vitamin d  supplement  5. GAD (generalized anxiety disorder) Stress management - ALPRAZolam (XANAX) 0.25 MG tablet; Take 1 tablet (0.25 mg total) by mouth 2 (two) times daily as needed.  Dispense: 60 tablet; Refill: 5 - escitalopram (LEXAPRO) 20 MG tablet; Take 1 tablet (20 mg total) by mouth daily as needed (depression).  Dispense: 90 tablet; Refill: 1  6. Primary insomnia Bedtime routine - zolpidem (AMBIEN) 10 MG tablet; Take 1 tablet (10 mg total) by mouth at bedtime as needed for sleep.  Dispense: 30 tablet; Refill: 5   Labs pending Health Maintenance reviewed Diet and exercise encouraged  Follow up plan: 6 months   Mary-Margaret Hassell Done, FNP

## 2022-10-17 NOTE — Patient Instructions (Signed)
Sciatica  Sciatica is pain, weakness, tingling, or loss of feeling (numbness) along the sciatic nerve. The sciatic nerve starts in the lower back and goes down the back of each leg. Sciatica usually affects one side of the body. Sciatica usually goes away on its own or with treatment. Sometimes, sciatica may come back. What are the causes? This condition happens when the sciatic nerve is pinched or has pressure put on it. This may be caused by: A disk in between the bones of the spine bulging out too far (herniated disk). Changes in the spinal disks due to aging. A condition that affects a muscle in the butt. Extra bone growth near the sciatic nerve. A break (fracture) of the area between your hip bones (pelvis). Pregnancy. Tumor. This is rare. What increases the risk? You are more likely to develop this condition if you: Play sports that put pressure or stress on the spine. Have poor strength and ease of movement (flexibility). Have had a back injury or back surgery. Sit for long periods of time. Do activities that involve bending or lifting over and over again. Are very overweight (obese). What are the signs or symptoms? Symptoms can vary from mild to very bad. They may include: Any of these problems in the lower back, leg, hip, or butt: Mild tingling, loss of feeling, or dull aches. A burning feeling. Sharp pains. Loss of feeling in the back of the calf or the sole of the foot. Leg weakness. Very bad back pain that makes it hard to move. These symptoms may get worse when you cough, sneeze, or laugh. They may also get worse when you sit or stand for long periods of time. How is this treated? This condition often gets better without any treatment. However, treatment may include: Changing or cutting back on physical activity when you have pain. Exercising, including strengthening and stretching. Putting ice or heat on the affected area. Shots of medicines to relieve pain and  swelling or to relax your muscles. Surgery. Follow these instructions at home: Medicines Take over-the-counter and prescription medicines only as told by your doctor. Ask your doctor if you should avoid driving or using machines while you are taking your medicine. Managing pain     If told, put ice on the affected area. To do this: Put ice in a plastic bag. Place a towel between your skin and the bag. Leave the ice on for 20 minutes, 2-3 times a day. If your skin turns bright red, take off the ice right away to prevent skin damage. The risk of skin damage is higher if you cannot feel pain, heat, or cold. If told, put heat on the affected area. Do this as often as told by your doctor. Use the heat source that your doctor tells you to use, such as a moist heat pack or a heating pad. Place a towel between your skin and the heat source. Leave the heat on for 20-30 minutes. If your skin turns bright red, take off the heat right away to prevent burns. The risk of burns is higher if you cannot feel pain, heat, or cold. Activity  Return to your normal activities when your doctor says that it is safe. Avoid activities that make your symptoms worse. Take short rests during the day. When you rest for a long time, do some physical activity or stretching between periods of rest. Avoid sitting for a long time without moving. Get up and move around at least one time each   hour. Do exercises and stretches as told by your doctor. Do not lift anything that is heavier than 10 lb (4.5 kg). Avoid lifting heavy things even when you do not have symptoms. Avoid lifting heavy things over and over. When you lift objects, always lift in a way that is safe for your body. To do this, you should: Bend your knees. Keep the object close to your body. Avoid twisting. General instructions Stay at a healthy weight. Wear comfortable shoes that support your feet. Avoid wearing high heels. Avoid sleeping on a mattress  that is too soft or too hard. You might have less pain if you sleep on a mattress that is firm enough to support your back. Contact a doctor if: Your pain is not controlled by medicine. Your pain does not get better. Your pain gets worse. Your pain lasts longer than 4 weeks. You lose weight without trying. Get help right away if: You cannot control when you pee (urinate) or poop (have a bowel movement). You have weakness in any of these areas and it gets worse: Lower back. The area between your hip bones. Butt. Legs. You have redness or swelling of your back. You have a burning feeling when you pee. Summary Sciatica is pain, weakness, tingling, or loss of feeling (numbness) along the sciatic nerve. This may include the lower back, legs, hips, and butt. This condition happens when the sciatic nerve is pinched or has pressure put on it. Treatment often includes rest, exercise, medicines, and putting ice or heat on the affected area. This information is not intended to replace advice given to you by your health care provider. Make sure you discuss any questions you have with your health care provider. Document Revised: 10/18/2021 Document Reviewed: 10/18/2021 Elsevier Patient Education  2023 [employer]  

## 2022-10-18 LAB — CBC WITH DIFFERENTIAL/PLATELET
Basophils Absolute: 0 10*3/uL (ref 0.0–0.2)
Basos: 0 %
EOS (ABSOLUTE): 0.2 10*3/uL (ref 0.0–0.4)
Eos: 3 %
Hematocrit: 40.3 % (ref 34.0–46.6)
Hemoglobin: 12.7 g/dL (ref 11.1–15.9)
Immature Grans (Abs): 0 10*3/uL (ref 0.0–0.1)
Immature Granulocytes: 0 %
Lymphocytes Absolute: 1.3 10*3/uL (ref 0.7–3.1)
Lymphs: 21 %
MCH: 29.7 pg (ref 26.6–33.0)
MCHC: 31.5 g/dL (ref 31.5–35.7)
MCV: 94 fL (ref 79–97)
Monocytes Absolute: 0.7 10*3/uL (ref 0.1–0.9)
Monocytes: 11 %
Neutrophils Absolute: 3.8 10*3/uL (ref 1.4–7.0)
Neutrophils: 65 %
Platelets: 273 10*3/uL (ref 150–450)
RBC: 4.28 x10E6/uL (ref 3.77–5.28)
RDW: 13 % (ref 11.7–15.4)
WBC: 6 10*3/uL (ref 3.4–10.8)

## 2022-10-18 LAB — CMP14+EGFR
ALT: 15 IU/L (ref 0–32)
AST: 20 IU/L (ref 0–40)
Albumin/Globulin Ratio: 1.6 (ref 1.2–2.2)
Albumin: 4.1 g/dL (ref 3.8–4.8)
Alkaline Phosphatase: 89 IU/L (ref 44–121)
BUN/Creatinine Ratio: 14 (ref 12–28)
BUN: 16 mg/dL (ref 8–27)
Bilirubin Total: <0.2 mg/dL (ref 0.0–1.2)
CO2: 23 mmol/L (ref 20–29)
Calcium: 9.4 mg/dL (ref 8.7–10.3)
Chloride: 100 mmol/L (ref 96–106)
Creatinine, Ser: 1.13 mg/dL — ABNORMAL HIGH (ref 0.57–1.00)
Globulin, Total: 2.5 g/dL (ref 1.5–4.5)
Glucose: 150 mg/dL — ABNORMAL HIGH (ref 70–99)
Potassium: 4.3 mmol/L (ref 3.5–5.2)
Sodium: 138 mmol/L (ref 134–144)
Total Protein: 6.6 g/dL (ref 6.0–8.5)
eGFR: 52 mL/min/{1.73_m2} — ABNORMAL LOW (ref >59)

## 2022-10-18 LAB — LIPID PANEL
Chol/HDL Ratio: 4 ratio (ref 0.0–4.4)
Cholesterol, Total: 183 mg/dL (ref 100–199)
HDL: 46 mg/dL (ref >39)
LDL Chol Calc (NIH): 95 mg/dL (ref 0–99)
Triglycerides: 246 mg/dL — ABNORMAL HIGH (ref 0–149)
VLDL Cholesterol Cal: 42 mg/dL — ABNORMAL HIGH (ref 5–40)

## 2022-10-20 ENCOUNTER — Other Ambulatory Visit: Payer: Self-pay

## 2022-10-20 ENCOUNTER — Other Ambulatory Visit: Payer: Medicare Other

## 2022-10-20 DIAGNOSIS — F411 Generalized anxiety disorder: Secondary | ICD-10-CM

## 2022-10-25 LAB — TOXASSURE SELECT 13 (MW), URINE

## 2022-10-28 ENCOUNTER — Other Ambulatory Visit: Payer: Self-pay

## 2022-10-28 DIAGNOSIS — M542 Cervicalgia: Secondary | ICD-10-CM

## 2022-10-28 MED ORDER — CYCLOBENZAPRINE HCL 5 MG PO TABS: 5.0000 mg | ORAL_TABLET | Freq: Three times a day (TID) | ORAL | 1 refills | Status: DC | PRN

## 2022-11-01 ENCOUNTER — Ambulatory Visit: Payer: Medicare Other | Admitting: Family Medicine

## 2022-11-18 ENCOUNTER — Other Ambulatory Visit: Payer: Self-pay | Admitting: Nurse Practitioner

## 2022-12-15 ENCOUNTER — Other Ambulatory Visit: Payer: Self-pay | Admitting: Nurse Practitioner

## 2022-12-15 DIAGNOSIS — E1142 Type 2 diabetes mellitus with diabetic polyneuropathy: Secondary | ICD-10-CM

## 2023-01-30 ENCOUNTER — Other Ambulatory Visit: Payer: Self-pay | Admitting: Nurse Practitioner

## 2023-01-30 DIAGNOSIS — I10 Essential (primary) hypertension: Secondary | ICD-10-CM

## 2023-01-31 ENCOUNTER — Other Ambulatory Visit: Payer: Self-pay | Admitting: Nurse Practitioner

## 2023-02-10 ENCOUNTER — Telehealth: Payer: Self-pay | Admitting: Nurse Practitioner

## 2023-02-10 ENCOUNTER — Telehealth (INDEPENDENT_AMBULATORY_CARE_PROVIDER_SITE_OTHER): Payer: Medicare Other | Admitting: Family Medicine

## 2023-02-10 ENCOUNTER — Encounter: Payer: Self-pay | Admitting: Family Medicine

## 2023-02-10 DIAGNOSIS — R051 Acute cough: Secondary | ICD-10-CM

## 2023-02-10 DIAGNOSIS — M5442 Lumbago with sciatica, left side: Secondary | ICD-10-CM

## 2023-02-10 DIAGNOSIS — G8929 Other chronic pain: Secondary | ICD-10-CM

## 2023-02-10 MED ORDER — BENZONATATE 100 MG PO CAPS: 100.0000 mg | ORAL_CAPSULE | Freq: Three times a day (TID) | ORAL | 0 refills | Status: DC | PRN

## 2023-02-10 MED ORDER — LEVOCETIRIZINE DIHYDROCHLORIDE 5 MG PO TABS
5.0000 mg | ORAL_TABLET | Freq: Every evening | ORAL | 0 refills | Status: DC
Start: 2023-02-10 — End: 2023-03-02

## 2023-02-10 NOTE — Telephone Encounter (Signed)
Patient needs to schedule an appt for x ray. Said she was told during appt on 7/19 to call back and set up.

## 2023-02-10 NOTE — Progress Notes (Signed)
Virtual Visit via video Note   Due to COVID-19 pandemic this visit was conducted virtually. This visit type was conducted due to national recommendations for restrictions regarding the COVID-19 Pandemic (e.g. social distancing, sheltering in place) in an effort to limit this patient's exposure and mitigate transmission in our community. All issues noted in this document were discussed and addressed.  A physical exam was not performed with this format.  I connected with  Tammy Hudson  on 02/10/23 at 1553 by video and verified that I am speaking with the correct person using two identifiers. Tammy Hudson is currently located at home and no one is currently with her during the visit. The provider, Gabriel Earing, FNP is located in their office at time of visit.  I discussed the limitations, risks, security and privacy concerns of performing an evaluation and management service by video  and the availability of in person appointments. I also discussed with the patient that there may be a patient responsible charge related to this service. The patient expressed understanding and agreed to proceed.   History and Present Illness:  Back Pain: Patient presents for presents evaluation of low back problems.  Symptoms have been present for several months and include pain in lower back and left leg (aching, burning, sharp, shooting, and tingling in character; 5/10 in severity). Initial inciting event: none. Symptoms are worst: with activity. Alleviating factors identifiable by patient are none. Exacerbating factors identifiable by patient are standing and walking. Treatments so far initiated by patient:  prednisone IM, oral prednisone, toradol, NSAIDs, tylenol  Previous lower back problems: none. Previous workup: none. Denies injury, fever, changes in bowel or bladder control, saddle anesthesia.   Cough: Patient complains of nonproductive cough and congestion .  Symptoms began 2 weeks ago.  The cough is  non-productive, without wheezing, dyspnea or hemoptysis and is aggravated by  talking  Associated symptoms include:postnasal drip. Patient does not have new pets. Patient does not have a history of asthma. Patient does have a history of environmental allergens. She would like a refill of tessalon perles as they have helped int he past. Denies fever, sore throat, chest pain.    ROS As per HPI.     Observations/Objective: Alert and oriented. Respirations unlabored. No cyanosis. Non toxic appearing. Normal mood and behavior.    Assessment and Plan: Diagnoses and all orders for this visit:  Acute cough Xyzal and tessalon perles as below. Return to office for new or worsening symptoms, or if symptoms persist.  -     benzonatate (TESSALON PERLES) 100 MG capsule; Take 1 capsule (100 mg total) by mouth 3 (three) times daily as needed for cough. -     levocetirizine (XYZAL) 5 MG tablet; Take 1 tablet (5 mg total) by mouth every evening.  Chronic left-sided low back pain with left-sided sciatica Recurrent with last episode last >6 weeks without improved. She had a toradol and steroid IM injection about 6 weeks ago at [facility]. Will obtain imaging and will notify patient of results and plan of care when report is available. No red flags.  -     DG Lumbar Spine 2-3 Views; Future     Follow Up Instructions: Return to office for new or worsening symptoms, or if symptoms persist.     I discussed the assessment and treatment plan with the patient. The patient was provided an opportunity to ask questions and all were answered. The patient agreed with the plan and demonstrated an understanding of  the instructions.   The patient was advised to call back or seek an in-person evaluation if the symptoms worsen or if the condition fails to improve as anticipated.  The above assessment and management plan was discussed with the patient. The patient verbalized understanding of and has agreed to the management  plan. Patient is aware to call the clinic if symptoms persist or worsen. Patient is aware when to return to the clinic for a follow-up visit. Patient educated on when it is appropriate to go to the emergency department.   Time call ended: 1559  I provided 6 minutes of face-to-face time during this encounter.    Gabriel Earing, FNP

## 2023-02-13 ENCOUNTER — Ambulatory Visit (INDEPENDENT_AMBULATORY_CARE_PROVIDER_SITE_OTHER): Payer: Medicare Other

## 2023-02-13 DIAGNOSIS — M5442 Lumbago with sciatica, left side: Secondary | ICD-10-CM

## 2023-02-13 DIAGNOSIS — G8929 Other chronic pain: Secondary | ICD-10-CM

## 2023-02-13 NOTE — Telephone Encounter (Signed)
Appt made for 7/22

## 2023-02-21 ENCOUNTER — Other Ambulatory Visit: Payer: Self-pay | Admitting: *Deleted

## 2023-02-21 DIAGNOSIS — M5136 Other intervertebral disc degeneration, lumbar region: Secondary | ICD-10-CM

## 2023-03-01 ENCOUNTER — Other Ambulatory Visit: Payer: Self-pay | Admitting: Nurse Practitioner

## 2023-03-01 ENCOUNTER — Other Ambulatory Visit: Payer: Self-pay

## 2023-03-01 ENCOUNTER — Ambulatory Visit: Payer: Medicare Other | Attending: Family Medicine

## 2023-03-01 ENCOUNTER — Other Ambulatory Visit: Payer: Self-pay | Admitting: Family Medicine

## 2023-03-01 DIAGNOSIS — M5136 Other intervertebral disc degeneration, lumbar region: Secondary | ICD-10-CM | POA: Insufficient documentation

## 2023-03-01 DIAGNOSIS — M5416 Radiculopathy, lumbar region: Secondary | ICD-10-CM | POA: Insufficient documentation

## 2023-03-01 DIAGNOSIS — R051 Acute cough: Secondary | ICD-10-CM

## 2023-03-01 DIAGNOSIS — M6281 Muscle weakness (generalized): Secondary | ICD-10-CM | POA: Diagnosis present

## 2023-03-01 NOTE — Therapy (Signed)
OUTPATIENT PHYSICAL THERAPY THORACOLUMBAR EVALUATION   Patient Name: Tammy Hudson MRN: [id number] DOB:05/31/1951, 72 y.o., female Today's Date: 03/01/2023  END OF SESSION:  PT End of Session - 03/01/23 1302     Visit Number 1    Number of Visits 8    Date for PT Re-Evaluation 05/19/23    PT Start Time 1302    PT Stop Time 1337    PT Time Calculation (min) 35 min    Activity Tolerance Patient tolerated treatment well    Behavior During Therapy [facility] for tasks assessed/performed             Past Medical History:  Diagnosis Date   Allergy    Anxiety    Arthritis    Cough variant asthma 11/05/2021   Depression    Diabetes mellitus without complication (HCC)    GERD (gastroesophageal reflux disease)    Hyperlipidemia    Hypertension    Neuromuscular disorder (HCC)    Past Surgical History:  Procedure Laterality Date   ABDOMINAL HYSTERECTOMY     COLONOSCOPY WITH PROPOFOL N/A 01/12/2022   Procedure: COLONOSCOPY WITH PROPOFOL;  Surgeon: Dolores Frame, MD;  Location: [facility];  Service: Gastroenterology;  Laterality: N/A;  220 pre op done already   FLEXIBLE SIGMOIDOSCOPY  01/11/2022   Procedure: FLEXIBLE SIGMOIDOSCOPY;  Surgeon: Marguerita Merles, Reuel Boom, MD;  Location: [facility];  Service: Gastroenterology;;   POLYPECTOMY  01/12/2022   Procedure: POLYPECTOMY INTESTINAL;  Surgeon: Dolores Frame, MD;  Location: [facility];  Service: Gastroenterology;;   Patient Active Problem List   Diagnosis Date Noted   Cough variant asthma 11/05/2021   Left-sided low back pain with left-sided sciatica 02/15/2016   Hyperlipidemia associated with type 2 diabetes mellitus (HCC) 01/29/2016   Vitamin D deficiency 01/29/2016   Essential hypertension 04/02/2015   Type 2 diabetes mellitus with peripheral neuropathy (HCC) 04/02/2015   Allergic rhinitis 04/02/2015   REFERRING PROVIDER: Gabriel Earing, FNP   REFERRING DIAG: DDD (degenerative disc disease),  lumbar   Rationale for Evaluation and Treatment: Rehabilitation  THERAPY DIAG:  Radiculopathy, lumbar region  Muscle weakness (generalized)  ONSET DATE: 2-3 years   SUBJECTIVE:                                                                                                                                                                                           SUBJECTIVE STATEMENT: Patient reports that her back has been bothering her for about 2-3 years. She notes that it is primarily her left low back radiating down to her left foot. She notes that it will getter better or  worse at times, but right now it is feeling better. She notes that her pain was so bad last week that her husband was having to help her get up. She had tried some therapy before, but this did not seem to help any.   PERTINENT HISTORY:  Hypertension, type 2 diabetes, allergies, arthritis, and depression  PAIN:  Are you having pain? Yes: NPRS scale: 4 (at best) 8 (at worst) /10 Pain location: low back and left leg into her foot Pain description: constant throbbing Aggravating factors: stand and walking (30 minutes)  Relieving factors: sitting with her legs up  PRECAUTIONS: None  RED FLAGS: None   WEIGHT BEARING RESTRICTIONS: No  FALLS:  Has patient fallen in last 6 months? No  LIVING ENVIRONMENT: Lives with: lives with their spouse Lives in: House/apartment Stairs: No Has following equipment at home: None  OCCUPATION: retired  PLOF: Independent  PATIENT GOALS: reduced pain, be able to stand and walk longer  NEXT MD VISIT: 04/20/23  OBJECTIVE:   DIAGNOSTIC FINDINGS: 02/13/23 lumbar x-ray IMPRESSION: Degenerative changes. No acute osseous abnormalities.  PATIENT SURVEYS:  FOTO 53.70  SCREENING FOR RED FLAGS: Bowel or bladder incontinence: No Spinal tumors: No Cauda equina syndrome: No Compression fracture: No Abdominal aneurysm: No  COGNITION: Overall cognitive status: Within  functional limits for tasks assessed     SENSATION: Light touch: Impaired with diminished sensation at L3, L5-S2 dermatomes Patient reports intermittent numbness in her left foot.   POSTURE: posterior pelvic tilt  PALPATION: TTP: left lumbar paraspinals, gluteals, and TFL  LUMBAR ROM:   AROM eval  Flexion 58  Extension 18  Right lateral flexion   Left lateral flexion   Right rotation 25% limited  Left rotation 25% limited   (Blank rows = not tested)  LOWER EXTREMITY ROM: WFL for activities assessed  LOWER EXTREMITY MMT:    MMT Right eval Left eval  Hip flexion 4/5 3+/5  Hip extension    Hip abduction    Hip adduction    Hip internal rotation    Hip external rotation    Knee flexion 4/5 4-/5  Knee extension 4/5 3+/5  Ankle dorsiflexion 4/5 3+/5  Ankle plantarflexion    Ankle inversion    Ankle eversion     (Blank rows = not tested)  GAIT: Assistive device utilized: None Level of assistance: Complete Independence Comments: No significant gait deviations observed  TODAY'S TREATMENT:                                                                                                                              DATE:     PATIENT EDUCATION:  Education details: POC, healing, prognosis, x-ray results, and goals for therapy Person educated: Patient Education method: Explanation Education comprehension: verbalized understanding  HOME EXERCISE PROGRAM:   ASSESSMENT:  CLINICAL IMPRESSION: Patient is a 72 y.o. female who was seen today for physical therapy evaluation and treatment for left lumbar radiculopathy. She presented  with moderate pain severity and low irritability as none of today's assessments significantly reproduced her familiar symptoms. However, she did exhibit a significant deficit in her left lower extremity strength compared to her right lower extremity. Recommend that she continue with skilled physical therapy to address her impairments to return to  her prior level of function.  OBJECTIVE IMPAIRMENTS: decreased activity tolerance, decreased mobility, decreased ROM, decreased strength, impaired sensation, impaired tone, postural dysfunction, and pain.   ACTIVITY LIMITATIONS: carrying, lifting, standing, and locomotion level  PARTICIPATION LIMITATIONS: meal prep, cleaning, laundry, shopping, and community activity  PERSONAL FACTORS: Time since onset of injury/illness/exacerbation and 3+ comorbidities: Hypertension, type 2 diabetes, allergies, arthritis, and depression  are also affecting patient's functional outcome.   REHAB POTENTIAL: Good  CLINICAL DECISION MAKING: Evolving/moderate complexity  EVALUATION COMPLEXITY: Moderate   GOALS: Goals reviewed with patient? Yes  LONG TERM GOALS: Target date: 03/29/23  Patient will be independent with her HEP. Baseline:  Goal status: INITIAL  2.  Patient will be able to complete her daily activities without her familiar pain exceeding 5/10. Baseline:  Goal status: INITIAL  3.  Patient will report being able to walk for at least 45 minutes without being limited by her familiar symptoms for improved function shopping. Baseline:  Goal status: INITIAL  4.  Patient will improve her left quadriceps strength to at least 4/5 for improved function with transfers. Baseline:  Goal status: INITIAL  PLAN:  PT FREQUENCY: 1-2x/week  PT DURATION: 4 weeks  PLANNED INTERVENTIONS: Therapeutic exercises, Therapeutic activity, Neuromuscular re-education, Balance training, Patient/Family education, Self Care, Joint mobilization, Electrical stimulation, Spinal mobilization, Cryotherapy, Moist heat, Manual therapy, and Re-evaluation.  PLAN FOR NEXT SESSION: Nustep, lower extremity and lumbar strengthening, and modalities as needed    Granville Lewis, PT 03/01/2023, 3:28 PM

## 2023-03-08 ENCOUNTER — Ambulatory Visit: Payer: Medicare Other | Admitting: Physical Therapy

## 2023-04-02 ENCOUNTER — Other Ambulatory Visit: Payer: Self-pay | Admitting: Nurse Practitioner

## 2023-04-02 DIAGNOSIS — F411 Generalized anxiety disorder: Secondary | ICD-10-CM

## 2023-04-05 ENCOUNTER — Other Ambulatory Visit: Payer: Self-pay | Admitting: Family

## 2023-04-19 ENCOUNTER — Other Ambulatory Visit: Payer: Self-pay | Admitting: Family Medicine

## 2023-04-19 DIAGNOSIS — R051 Acute cough: Secondary | ICD-10-CM

## 2023-04-20 ENCOUNTER — Ambulatory Visit (INDEPENDENT_AMBULATORY_CARE_PROVIDER_SITE_OTHER): Payer: Medicare Other | Admitting: Nurse Practitioner

## 2023-04-20 ENCOUNTER — Other Ambulatory Visit: Payer: Medicare Other

## 2023-04-20 ENCOUNTER — Encounter: Payer: Self-pay | Admitting: Nurse Practitioner

## 2023-04-20 VITALS — BP 138/90 | HR 100 | Temp 97.4°F | Resp 20 | Ht 67.0 in | Wt 222.0 lb

## 2023-04-20 DIAGNOSIS — E1169 Type 2 diabetes mellitus with other specified complication: Secondary | ICD-10-CM | POA: Diagnosis not present

## 2023-04-20 DIAGNOSIS — E1142 Type 2 diabetes mellitus with diabetic polyneuropathy: Secondary | ICD-10-CM | POA: Diagnosis not present

## 2023-04-20 DIAGNOSIS — F5101 Primary insomnia: Secondary | ICD-10-CM

## 2023-04-20 DIAGNOSIS — E785 Hyperlipidemia, unspecified: Secondary | ICD-10-CM

## 2023-04-20 DIAGNOSIS — E119 Type 2 diabetes mellitus without complications: Secondary | ICD-10-CM

## 2023-04-20 DIAGNOSIS — Z7984 Long term (current) use of oral hypoglycemic drugs: Secondary | ICD-10-CM

## 2023-04-20 DIAGNOSIS — I10 Essential (primary) hypertension: Secondary | ICD-10-CM | POA: Diagnosis not present

## 2023-04-20 DIAGNOSIS — F411 Generalized anxiety disorder: Secondary | ICD-10-CM

## 2023-04-20 DIAGNOSIS — E559 Vitamin D deficiency, unspecified: Secondary | ICD-10-CM

## 2023-04-20 LAB — BAYER DCA HB A1C WAIVED: HB A1C (BAYER DCA - WAIVED): 6.3 % — ABNORMAL HIGH (ref 4.8–5.6)

## 2023-04-20 MED ORDER — BENZONATATE 100 MG PO CAPS: 100.0000 mg | ORAL_CAPSULE | Freq: Three times a day (TID) | ORAL | 0 refills | Status: DC | PRN

## 2023-04-20 MED ORDER — ALPRAZOLAM 0.25 MG PO TABS
0.2500 mg | ORAL_TABLET | Freq: Two times a day (BID) | ORAL | 5 refills | Status: DC | PRN
Start: 2023-04-20 — End: 2023-10-16

## 2023-04-20 MED ORDER — LOSARTAN POTASSIUM 100 MG PO TABS
100.0000 mg | ORAL_TABLET | Freq: Every day | ORAL | 1 refills | Status: DC
Start: 2023-04-20 — End: 2023-10-16

## 2023-04-20 MED ORDER — ZOLPIDEM TARTRATE 10 MG PO TABS
10.0000 mg | ORAL_TABLET | Freq: Every evening | ORAL | 5 refills | Status: DC | PRN
Start: 2023-04-20 — End: 2023-10-16

## 2023-04-20 MED ORDER — ESCITALOPRAM OXALATE 20 MG PO TABS
20.0000 mg | ORAL_TABLET | Freq: Every day | ORAL | 1 refills | Status: DC | PRN
Start: 2023-04-20 — End: 2023-10-16

## 2023-04-20 MED ORDER — SITAGLIPTIN PHOSPHATE 100 MG PO TABS
100.0000 mg | ORAL_TABLET | Freq: Every day | ORAL | 1 refills | Status: DC
Start: 2023-04-20 — End: 2023-08-16

## 2023-04-20 MED ORDER — AMLODIPINE BESYLATE 5 MG PO TABS: 5.0000 mg | ORAL_TABLET | Freq: Every day | ORAL | 1 refills | Status: DC

## 2023-04-20 MED ORDER — SIMVASTATIN 20 MG PO TABS
20.0000 mg | ORAL_TABLET | Freq: Every day | ORAL | 1 refills | Status: DC
Start: 2023-04-20 — End: 2023-09-21

## 2023-04-20 NOTE — Patient Instructions (Signed)
Fall Prevention in the Home, Adult Falls can cause injuries and can happen to people of all ages. There are many things you can do to make your home safer and to help prevent falls. What actions can I take to prevent falls? General information Use good lighting in all rooms. Make sure to: Replace any light bulbs that burn out. Turn on the lights in dark areas and use night-lights. Keep items that you use often in easy-to-reach places. Lower the shelves around your home if needed. Move furniture so that there are clear paths around it. Do not use throw rugs or other things on the floor that can make you trip. If any of your floors are uneven, fix them. Add color or contrast paint or tape to clearly mark and help you see: Grab bars or handrails. First and last steps of staircases. Where the edge of each step is. If you use a ladder or stepladder: Make sure that it is fully opened. Do not climb a closed ladder. Make sure the sides of the ladder are locked in place. Have someone hold the ladder while you use it. Know where your pets are as you move through your home. What can I do in the bathroom?     Keep the floor dry. Clean up any water on the floor right away. Remove soap buildup in the bathtub or shower. Buildup makes bathtubs and showers slippery. Use non-skid mats or decals on the floor of the bathtub or shower. Attach bath mats securely with double-sided, non-slip rug tape. If you need to sit down in the shower, use a non-slip stool. Install grab bars by the toilet and in the bathtub and shower. Do not use towel bars as grab bars. What can I do in the bedroom? Make sure that you have a light by your bed that is easy to reach. Do not use any sheets or blankets on your bed that hang to the floor. Have a firm chair or bench with side arms that you can use for support when you get dressed. What can I do in the kitchen? Clean up any spills right away. If you need to reach something  above you, use a step stool with a grab bar. Keep electrical cords out of the way. Do not use floor polish or wax that makes floors slippery. What can I do with my stairs? Do not leave anything on the stairs. Make sure that you have a light switch at the top and the bottom of the stairs. Make sure that there are handrails on both sides of the stairs. Fix handrails that are broken or loose. Install non-slip stair treads on all your stairs if they do not have carpet. Avoid having throw rugs at the top or bottom of the stairs. Choose a carpet that does not hide the edge of the steps on the stairs. Make sure that the carpet is firmly attached to the stairs. Fix carpet that is loose or worn. What can I do on the outside of my home? Use bright outdoor lighting. Fix the edges of walkways and driveways and fix any cracks. Clear paths of anything that can make you trip, such as tools or rocks. Add color or contrast paint or tape to clearly mark and help you see anything that might make you trip as you walk through a door, such as a raised step or threshold. Trim any bushes or trees on paths to your home. Check to see if handrails are loose   or broken and that both sides of all steps have handrails. Install guardrails along the edges of any raised decks and porches. Have leaves, snow, or ice cleared regularly. Use sand, salt, or ice melter on paths if you live where there is ice and snow during the winter. Clean up any spills in your garage right away. This includes grease or oil spills. What other actions can I take? Review your medicines with your doctor. Some medicines can cause dizziness or changes in blood pressure, which increase your risk of falling. Wear shoes that: Have a low heel. Do not wear high heels. Have rubber bottoms and are closed at the toe. Feel good on your feet and fit well. Use tools that help you move around if needed. These include: Canes. Walkers. Scooters. Crutches. Ask  your doctor what else you can do to help prevent falls. This may include seeing a physical therapist to learn to do exercises to move better and get stronger. Where to find more information Centers for Disease Control and Prevention, [employer]: cdc.gov National Institute on Aging: nia.nih.gov National Institute on Aging: nia.nih.gov Contact a doctor if: You are afraid of falling at home. You feel weak, drowsy, or dizzy at home. You fall at home. Get help right away if you: Lose consciousness or have trouble moving after a fall. Have a fall that causes a head injury. These symptoms may be an emergency. Get help right away. Call 911. Do not wait to see if the symptoms will go away. Do not drive yourself to the hospital. This information is not intended to replace advice given to you by your health care provider. Make sure you discuss any questions you have with your health care provider. Document Revised: 03/14/2022 Document Reviewed: 03/14/2022 Elsevier Patient Education  2024 Elsevier Inc.  

## 2023-04-20 NOTE — Progress Notes (Signed)
Subjective:    Patient ID: Daryel November, female    DOB: 1950/08/13, 72 y.o.   MRN: [id number]   Chief Complaint: medical management of chronic issues     HPI:  Weltha Horak is a 72 y.o. who identifies as a female who was assigned female at birth.   Social history: Lives with: by herself Work history: retired   Water engineer in today for follow up of the following chronic medical issues:  1. Essential hypertension No c/o chest pain, sob or headache. Patient stopped her losartan and has been doubling up on Norvasc and she says that works better for her. Blood pressure reading at home have been running since changing her meds. BP Readings from Last 3 Encounters:  04/20/23 (!) 138/90  10/17/22 131/79  08/22/22 139/76      2. Hyperlipidemia associated with type 2 diabetes mellitus (HCC) Does try  to watch diet. No dedicated exercise. Lab Results  Component Value Date   CHOL 183 10/17/2022   HDL 46 10/17/2022   LDLCALC 95 10/17/2022   TRIG 246 (H) 10/17/2022   CHOLHDL 4.0 10/17/2022      3. Type 2 diabetes mellitus with peripheral neuropathy (HCC) Denies any sores on her feet. Has numbness in toes  4. Diabetes mellitus treated with oral medication (HCC) Does not check blood sugar at home. Lab Results  Component Value Date   HGBA1C 6.9 (H) 10/17/2022    5. Vitamin D deficiency Daily vitamin d supplement   New complaints: None today  No Known Allergies Outpatient Encounter Medications as of 04/20/2023  Medication Sig   albuterol (PROVENTIL) (2.5 MG/3ML) 0.083% nebulizer solution Inhale 3 mLs into the lungs every 4 (four) hours as needed for shortness of breath.   ALPRAZolam (XANAX) 0.25 MG tablet Take 1 tablet (0.25 mg total) by mouth 2 (two) times daily as needed.   amLODipine (NORVASC) 5 MG tablet Take 1 tablet (5 mg total) by mouth daily.   benzonatate (TESSALON PERLES) 100 MG capsule Take 1 capsule (100 mg total) by mouth 3 (three) times daily as needed for cough.    Cinnamon 500 MG capsule Take 500 mg by mouth daily.   cyclobenzaprine (FLEXERIL) 10 MG tablet TAKE 1 TABLET BY MOUTH THREE TIMES A DAY AS NEEDED FOR MUSCLE SPASM   cyclobenzaprine (FLEXERIL) 5 MG tablet Take 1 tablet (5 mg total) by mouth 3 (three) times daily as needed for muscle spasms.   diclofenac Sodium (VOLTAREN) 1 % GEL Apply 4 g topically 4 (four) times daily. (Patient not taking: Reported on 10/17/2022)   escitalopram (LEXAPRO) 20 MG tablet TAKE 1 TABLET (20 MG TOTAL) BY MOUTH DAILY AS NEEDED (DEPRESSION).   fluticasone-salmeterol (ADVAIR HFA) 115-21 MCG/ACT inhaler Inhale 2 puffs into the lungs 2 (two) times daily.   folic acid (FOLVITE) 800 MCG tablet Take 800 mcg by mouth daily.   levocetirizine (XYZAL) 5 MG tablet TAKE 1 TABLET BY MOUTH EVERY DAY IN THE EVENING   losartan (COZAAR) 100 MG tablet TAKE 1 TABLET BY MOUTH EVERY DAY   methotrexate (RHEUMATREX) 2.5 MG tablet Take 15 mg by mouth every Tuesday.   simvastatin (ZOCOR) 20 MG tablet Take 1 tablet (20 mg total) by mouth at bedtime.   sitaGLIPtin (JANUVIA) 100 MG tablet TAKE 1 TABLET DAILY   zolpidem (AMBIEN) 10 MG tablet Take 1 tablet (10 mg total) by mouth at bedtime as needed for sleep.   No facility-administered encounter medications on file as of 04/20/2023.    Past Surgical  History:  Procedure Laterality Date   ABDOMINAL HYSTERECTOMY     COLONOSCOPY WITH PROPOFOL N/A 01/12/2022   Procedure: COLONOSCOPY WITH PROPOFOL;  Surgeon: Dolores Frame, MD;  [facility];  Service: Gastroenterology;  Laterality: N/A;  220 pre op done already   FLEXIBLE SIGMOIDOSCOPY  01/11/2022   Procedure: FLEXIBLE SIGMOIDOSCOPY;  Surgeon: Dolores Frame, MD;  [facility];  Service: Gastroenterology;;   POLYPECTOMY  01/12/2022   Procedure: POLYPECTOMY INTESTINAL;  Surgeon: Dolores Frame, MD;  [facility];  Service: Gastroenterology;;    No family history on  file.    Controlled substance contract: n/a     Review of Systems  Constitutional:  Negative for diaphoresis.  Eyes:  Negative for pain.  Respiratory:  Negative for shortness of breath.   Cardiovascular:  Negative for chest pain, palpitations and leg swelling.  Gastrointestinal:  Negative for abdominal pain.  Endocrine: Negative for polydipsia.  Skin:  Negative for rash.  Neurological:  Negative for dizziness, weakness and headaches.  Hematological:  Does not bruise/bleed easily.  All other systems reviewed and are negative.      Objective:   Physical Exam Vitals and nursing note reviewed.  Constitutional:      General: She is not in acute distress.    Appearance: Normal appearance. She is well-developed.  HENT:     Head: Normocephalic.     Right Ear: Tympanic membrane normal.     Left Ear: Tympanic membrane normal.     Nose: Nose normal.     Mouth/Throat:     Mouth: Mucous membranes are moist.  Eyes:     Pupils: Pupils are equal, round, and reactive to light.  Neck:     Vascular: No carotid bruit or JVD.  Cardiovascular:     Rate and Rhythm: Normal rate and regular rhythm.     Heart sounds: Normal heart sounds.  Pulmonary:     Effort: Pulmonary effort is normal. No respiratory distress.     Breath sounds: Normal breath sounds. No wheezing or rales.  Chest:     Chest wall: No tenderness.  Abdominal:     General: Bowel sounds are normal. There is no distension or abdominal bruit.     Palpations: Abdomen is soft. There is no hepatomegaly, splenomegaly, mass or pulsatile mass.     Tenderness: There is no abdominal tenderness.  Musculoskeletal:        General: Normal range of motion.     Cervical back: Normal range of motion and neck supple.  Lymphadenopathy:     Cervical: No cervical adenopathy.  Skin:    General: Skin is warm and dry.  Neurological:     Mental Status: She is alert and oriented to person, place, and time.     Deep Tendon Reflexes: Reflexes  are normal and symmetric.  Psychiatric:        Behavior: Behavior normal.        Thought Content: Thought content normal.        Judgment: Judgment normal.     BP (!) 138/90   Pulse 100   Temp (!) 97.4 F (36.3 C) (Temporal)   Resp 20   Ht 5\' 7"  (1.702 m)   Wt 222 lb (100.7 kg)   SpO2 90%   BMI 34.77 kg/m   Hgba1c discussed at appointment 6.3%     Assessment & Plan:   Jorden Pendergast comes in today with chief complaint of Medical Management of Chronic Issues  Diagnosis and orders addressed:  1. Essential hypertension Low sodium diet - CBC with Differential/Platelet - CMP14+EGFR - amLODipine (NORVASC) 5 MG tablet; Take 1 tablet (5 mg total) by mouth daily.  Dispense: 90 tablet; Refill: 1 - losartan (COZAAR) 100 MG tablet; Take 1 tablet (100 mg total) by mouth daily.  Dispense: 90 tablet; Refill: 1  2. Hyperlipidemia associated with type 2 diabetes mellitus (HCC) Low fat diet - Lipid panel - simvastatin (ZOCOR) 20 MG tablet; Take 1 tablet (20 mg total) by mouth at bedtime.  Dispense: 90 tablet; Refill: 1  3. Type 2 diabetes mellitus with peripheral neuropathy (HCC) Check feet daily  4. Diabetes mellitus treated with oral medication (HCC) Continue to watch carbs in diet - Bayer DCA Hb A1c Waived - sitaGLIPtin (JANUVIA) 100 MG tablet; Take 1 tablet (100 mg total) by mouth daily.  Dispense: 90 tablet; Refill: 1   5. Vitamin D deficiency Continue vitamin d supplement  6. GAD (generalized anxiety disorder) Stress management - escitalopram (LEXAPRO) 20 MG tablet; Take 1 tablet (20 mg total) by mouth daily as needed (depression).  Dispense: 90 tablet; Refill: 1 - ALPRAZolam (XANAX) 0.25 MG tablet; Take 1 tablet (0.25 mg total) by mouth 2 (two) times daily as needed.  Dispense: 60 tablet; Refill: 5  7. Primary insomnia Bedtime routine - zolpidem (AMBIEN) 10 MG tablet; Take 1 tablet (10 mg total) by mouth at bedtime as needed for sleep.  Dispense: 30 tablet; Refill:  5   Labs pending Health Maintenance reviewed Diet and exercise encouraged  Follow up plan: 6 months   Mary-Margaret Daphine Deutscher, FNP

## 2023-04-21 LAB — CBC WITH DIFFERENTIAL/PLATELET
Basophils Absolute: 0 10*3/uL (ref 0.0–0.2)
Basos: 1 %
EOS (ABSOLUTE): 0.1 10*3/uL (ref 0.0–0.4)
Eos: 2 %
Hematocrit: 42.5 % (ref 34.0–46.6)
Hemoglobin: 13.1 g/dL (ref 11.1–15.9)
Immature Grans (Abs): 0 10*3/uL (ref 0.0–0.1)
Immature Granulocytes: 0 %
Lymphocytes Absolute: 1.6 10*3/uL (ref 0.7–3.1)
Lymphs: 34 %
MCH: 29.4 pg (ref 26.6–33.0)
MCHC: 30.8 g/dL — ABNORMAL LOW (ref 31.5–35.7)
MCV: 95 fL (ref 79–97)
Monocytes Absolute: 0.6 10*3/uL (ref 0.1–0.9)
Monocytes: 13 %
Neutrophils Absolute: 2.4 10*3/uL (ref 1.4–7.0)
Neutrophils: 50 %
Platelets: 232 10*3/uL (ref 150–450)
RBC: 4.46 x10E6/uL (ref 3.77–5.28)
RDW: 13.9 % (ref 11.7–15.4)
WBC: 4.8 10*3/uL (ref 3.4–10.8)

## 2023-04-21 LAB — CMP14+EGFR
ALT: 17 [IU]/L (ref 0–32)
AST: 31 [IU]/L (ref 0–40)
Albumin: 4.3 g/dL (ref 3.8–4.8)
Alkaline Phosphatase: 108 [IU]/L (ref 44–121)
BUN/Creatinine Ratio: 18 (ref 12–28)
BUN: 23 mg/dL (ref 8–27)
Bilirubin Total: 0.3 mg/dL (ref 0.0–1.2)
CO2: 24 mmol/L (ref 20–29)
Calcium: 9.4 mg/dL (ref 8.7–10.3)
Chloride: 102 mmol/L (ref 96–106)
Creatinine, Ser: 1.28 mg/dL — ABNORMAL HIGH (ref 0.57–1.00)
Globulin, Total: 2.6 g/dL (ref 1.5–4.5)
Glucose: 121 mg/dL — ABNORMAL HIGH (ref 70–99)
Potassium: 5.3 mmol/L — ABNORMAL HIGH (ref 3.5–5.2)
Sodium: 142 mmol/L (ref 134–144)
Total Protein: 6.9 g/dL (ref 6.0–8.5)
eGFR: 45 mL/min/{1.73_m2} — ABNORMAL LOW (ref >59)

## 2023-04-21 LAB — LIPID PANEL
Chol/HDL Ratio: 4.3 {ratio} (ref 0.0–4.4)
Cholesterol, Total: 165 mg/dL (ref 100–199)
HDL: 38 mg/dL — ABNORMAL LOW (ref >39)
LDL Chol Calc (NIH): 82 mg/dL (ref 0–99)
Triglycerides: 273 mg/dL — ABNORMAL HIGH (ref 0–149)
VLDL Cholesterol Cal: 45 mg/dL — ABNORMAL HIGH (ref 5–40)

## 2023-05-28 ENCOUNTER — Other Ambulatory Visit: Payer: Self-pay | Admitting: Nurse Practitioner

## 2023-05-29 ENCOUNTER — Encounter: Payer: Self-pay | Admitting: Nurse Practitioner

## 2023-05-29 ENCOUNTER — Telehealth (INDEPENDENT_AMBULATORY_CARE_PROVIDER_SITE_OTHER): Payer: Medicare Other | Admitting: Nurse Practitioner

## 2023-05-29 DIAGNOSIS — M542 Cervicalgia: Secondary | ICD-10-CM | POA: Diagnosis not present

## 2023-05-29 DIAGNOSIS — U071 COVID-19: Secondary | ICD-10-CM | POA: Diagnosis not present

## 2023-05-29 DIAGNOSIS — R051 Acute cough: Secondary | ICD-10-CM | POA: Diagnosis not present

## 2023-05-29 MED ORDER — BENZONATATE 100 MG PO CAPS
100.0000 mg | ORAL_CAPSULE | Freq: Three times a day (TID) | ORAL | 0 refills | Status: DC | PRN
Start: 2023-05-29 — End: 2023-11-21

## 2023-05-29 MED ORDER — CYCLOBENZAPRINE HCL 5 MG PO TABS: 5.0000 mg | ORAL_TABLET | Freq: Three times a day (TID) | ORAL | 1 refills | Status: DC | PRN

## 2023-05-29 NOTE — Patient Instructions (Signed)

## 2023-05-29 NOTE — Progress Notes (Signed)
Virtual Visit  Note Due to COVID-19 pandemic this visit was conducted virtually. This visit type was conducted due to national recommendations for restrictions regarding the COVID-19 Pandemic (e.g. social distancing, sheltering in place) in an effort to limit this patient's exposure and mitigate transmission in our community. All issues noted in this document were discussed and addressed.  A physical exam was not performed with this format.  I connected with Tammy Hudson on 05/29/23 at 1:38 by telephone and verified that I am speaking with the correct person using two identifiers. Tammy Hudson is currently located at home and no one is currently with her during visit. The provider, Mary-Margaret Daphine Deutscher, FNP is located in their office at time of visit.  I discussed the limitations, risks, security and privacy concerns of performing an evaluation and management service by telephone and the availability of in person appointments. I also discussed with the patient that there may be a patient responsible charge related to this service. The patient expressed understanding and agreed to proceed.   History and Present Illness:  URI  This is a new problem. The current episode started in the past 7 days. The problem has been waxing and waning. There has been no fever. Associated symptoms include congestion, coughing, headaches and rhinorrhea. Pertinent negatives include no sinus pain or sore throat. She has tried acetaminophen for the symptoms. The treatment provided mild relief.   Needs refill  of flexeril- tales intermittently for back pain   Review of Systems  HENT:  Positive for congestion and rhinorrhea. Negative for sinus pain and sore throat.   Respiratory:  Positive for cough.   Neurological:  Positive for headaches.     Observations/Objective: Alert and oriented- answers all questions appropriately No distress Deep dry cough Raspy voice  Assessment and Plan: Dimitri Dsouza in today  with chief complaint of No chief complaint on file.   1. Lab test positive for detection of COVID-19 virus Was over a week ago  2. Acute cough 1. Take meds as prescribed 2. Use a cool mist humidifier especially during the winter months and when heat has been humid. 3. Use saline nose sprays frequently 4. Saline irrigations of the nose can be very helpful if done frequently.  * 4X daily for 1 week*  * Use of a nettie pot can be helpful with this. Follow directions with this* 5. Drink plenty of fluids 6. Keep thermostat turn down low 7.For any cough or congestion- tessalon perles 8. For fever or aces or pains- take tylenol or ibuprofen appropriate for age and weight.  * for fevers greater than 101 orally you may alternate ibuprofen and tylenol every  3 hours.    - benzonatate (TESSALON PERLES) 100 MG capsule; Take 1 capsule (100 mg total) by mouth 3 (three) times daily as needed for cough.  Dispense: 20 capsule; Refill: 0  3. Neck pain - cyclobenzaprine (FLEXERIL) 5 MG tablet; Take 1 tablet (5 mg total) by mouth 3 (three) times daily as needed for muscle spasms.  Dispense: 30 tablet; Refill: 1    Follow Up Instructions: prn    I discussed the assessment and treatment plan with the patient. The patient was provided an opportunity to ask questions and all were answered. The patient agreed with the plan and demonstrated an understanding of the instructions.   The patient was advised to call back or seek an in-person evaluation if the symptoms worsen or if the condition fails to improve as anticipated.  The above  assessment and management plan was discussed with the patient. The patient verbalized understanding of and has agreed to the management plan. Patient is aware to call the clinic if symptoms persist or worsen. Patient is aware when to return to the clinic for a follow-up visit. Patient educated on when it is appropriate to go to the emergency department.   Time call ended:   1:50  I provided 12 minutes of  non face-to-face time during this encounter.    Mary-Margaret Daphine Deutscher, FNP

## 2023-06-12 ENCOUNTER — Telehealth: Payer: Self-pay | Admitting: Family Medicine

## 2023-06-12 ENCOUNTER — Other Ambulatory Visit: Payer: Self-pay

## 2023-06-12 DIAGNOSIS — I10 Essential (primary) hypertension: Secondary | ICD-10-CM

## 2023-06-12 MED ORDER — AMLODIPINE BESYLATE 5 MG PO TABS: 5.0000 mg | ORAL_TABLET | Freq: Every day | ORAL | 1 refills | Status: DC

## 2023-06-12 NOTE — Telephone Encounter (Signed)
Copied from CRM [id number]. Topic: Clinical - Medication Refill >> Jun 12, 2023 10:52 AM Prudencio Pair wrote: Most Recent Primary Care Visit:  Provider: Bennie Pierini  Department: [facility] MED  Visit Type: OFFICE VISIT  Date: 04/20/2023  Medication: Amlodipine  Has the patient contacted their pharmacy? Yes (Agent: If no, request that the patient contact the pharmacy for the refill. If patient does not wish to contact the pharmacy document the reason why and proceed with request.) (Agent: If yes, when and what did the pharmacy advise?)  Is this the correct pharmacy for this prescription? Yes If no, delete pharmacy and type the correct one.  This is the patient's preferred pharmacy: [facility] - [place], [place] - [address] [place] [address] Phone: [phone] Fax: [phone]  [facility] [place], [place] - [place] [address] Phone: [phone] Fax: [phone]  [id number] [place], [place] - [address] [place] [address] Phone: [phone] Fax: [phone]  [facility] - [place], [place] - [address] [place] [address] Phone: [phone] Fax: [phone]   Has the prescription been filled recently? Yes  Is the patient out of the medication? Yes  Has the patient been seen for an appointment in the last year OR does the patient have an upcoming appointment? Yes  Can we respond through MyChart? Yes  Agent: Please be advised that Rx refills may take up to 3 business days. We ask that you follow-up with your pharmacy.

## 2023-06-13 ENCOUNTER — Ambulatory Visit (INDEPENDENT_AMBULATORY_CARE_PROVIDER_SITE_OTHER): Payer: Medicare Other | Admitting: Family Medicine

## 2023-06-13 ENCOUNTER — Encounter: Payer: Self-pay | Admitting: Family Medicine

## 2023-06-13 VITALS — BP 129/73 | HR 90 | Temp 95.5°F | Ht 67.0 in | Wt 224.0 lb

## 2023-06-13 DIAGNOSIS — M26623 Arthralgia of bilateral temporomandibular joint: Secondary | ICD-10-CM

## 2023-06-13 MED ORDER — PREDNISONE 20 MG PO TABS
40.0000 mg | ORAL_TABLET | Freq: Every day | ORAL | 0 refills | Status: AC
Start: 2023-06-13 — End: 2023-06-18

## 2023-06-13 NOTE — Progress Notes (Signed)
Subjective:  Patient ID: Tammy Hudson, female    DOB: 1951/01/23, 72 y.o.   MRN: [id number]  Patient Care Team: Bennie Pierini, FNP as PCP - General (Family Medicine) Casimer Lanius, MD as Consulting Physician (Rheumatology) Nyoka Cowden, MD as Consulting Physician (Pulmonary Disease) Delora Fuel, OD (Optometry)   Chief Complaint:  faical pain  (Jaw line pain on and off for a few months. )   HPI: Claudell Hudson is a 72 y.o. female presenting on 06/13/2023 for faical pain  (Jaw line pain on and off for a few months. )   Discussed the use of AI scribe software for clinical note transcription with the patient, who gave verbal consent to proceed.  History of Present Illness   The patient presents with bilateral jaw pain, which has been persisting for a while and has recently worsened. The pain is described as a 'crack' and is exacerbated by chewing. The patient reports that the pain radiates from one side of the jaw to the other. They deny any attempts at self-treatment for the symptoms.  The patient denies grinding their teeth at night and confirms regular dental check-ups. They also report a sensation of locking or popping out of joint in the jaw. No other associated symptoms were reported.        Relevant past medical, surgical, family, and social history reviewed and updated as indicated.  Allergies and medications reviewed and updated. Data reviewed: Chart in Epic.   Past Medical History:  Diagnosis Date   Allergy    Anxiety    Arthritis    Cough variant asthma 11/05/2021   Depression    Diabetes mellitus without complication (HCC)    GERD (gastroesophageal reflux disease)    Hyperlipidemia    Hypertension    Neuromuscular disorder (HCC)     Past Surgical History:  Procedure Laterality Date   ABDOMINAL HYSTERECTOMY     COLONOSCOPY WITH PROPOFOL N/A 01/12/2022   Procedure: COLONOSCOPY WITH PROPOFOL;  Surgeon: Dolores Frame, MD;  [facility];  Service: Gastroenterology;  Laterality: N/A;  220 pre op done already   FLEXIBLE SIGMOIDOSCOPY  01/11/2022   Procedure: FLEXIBLE SIGMOIDOSCOPY;  Surgeon: Marguerita Merles, Reuel Boom, MD;  Location: [facility];  Service: Gastroenterology;;   POLYPECTOMY  01/12/2022   Procedure: POLYPECTOMY INTESTINAL;  Surgeon: Marguerita Merles, Reuel Boom, MD;  Location[facility];  Service: Gastroenterology;;    Social History   Socioeconomic History   Marital status: Married    Spouse name: Not on file   Number of children: Not on file   Years of education: Not on file   Highest education level: Not on file  Occupational History   Not on file  Tobacco Use   Smoking status: Former    Current packs/day: 0.00    Types: Cigarettes    Quit date: 04/02/1995    Years since quitting: 28.2   Smokeless tobacco: Never  Substance and Sexual Activity   Alcohol use: No    Alcohol/week: 0.0 standard drinks of alcohol   Drug use: No   Sexual activity: Not on file  Other Topics Concern   Not on file  Social History Narrative   Retired from the [employer]    2 children    Husband lives in [place]    Social Determinants of Health   Financial Resource Strain: Low Risk  (06/27/2022)   Overall Financial Resource Strain (CARDIA)    Difficulty of Paying Living Expenses: Not hard at  all  Food Insecurity: No Food Insecurity (06/27/2022)   Hunger Vital Sign    Worried About Running Out of Food in the Last Year: Never true    Ran Out of Food in the Last Year: Never true  Transportation Needs: No Transportation Needs (06/27/2022)   PRAPARE - Administrator, Civil Service (Medical): No    Lack of Transportation (Non-Medical): No  Physical Activity: Sufficiently Active (06/27/2022)   Exercise Vital Sign    Days of Exercise per Week: 5 days    Minutes of Exercise per Session: 30 min  Stress: No Stress Concern Present (06/27/2022)   [employer] of Occupational Health - Occupational Stress  Questionnaire    Feeling of Stress : Not at all  Social Connections: Socially Integrated (06/27/2022)   Social Connection and Isolation Panel [NHANES]    Frequency of Communication with Friends and Family: More than three times a week    Frequency of Social Gatherings with Friends and Family: Three times a week    Attends Religious Services: More than 4 times per year    Active Member of Clubs or Organizations: Yes    Attends Banker Meetings: More than 4 times per year    Marital Status: Married  Catering manager Violence: Not At Risk (06/27/2022)   Humiliation, Afraid, Rape, and Kick questionnaire    Fear of Current or Ex-Partner: No    Emotionally Abused: No    Physically Abused: No    Sexually Abused: No    Outpatient Encounter Medications as of 06/13/2023  Medication Sig   albuterol (PROVENTIL) (2.5 MG/3ML) 0.083% nebulizer solution Inhale 3 mLs into the lungs every 4 (four) hours as needed for shortness of breath.   ALPRAZolam (XANAX) 0.25 MG tablet Take 1 tablet (0.25 mg total) by mouth 2 (two) times daily as needed.   benzonatate (TESSALON PERLES) 100 MG capsule Take 1 capsule (100 mg total) by mouth 3 (three) times daily as needed for cough.   Cinnamon 500 MG capsule Take 500 mg by mouth daily.   cyclobenzaprine (FLEXERIL) 5 MG tablet Take 1 tablet (5 mg total) by mouth 3 (three) times daily as needed for muscle spasms.   escitalopram (LEXAPRO) 20 MG tablet Take 1 tablet (20 mg total) by mouth daily as needed (depression).   fluticasone-salmeterol (ADVAIR HFA) 115-21 MCG/ACT inhaler Inhale 2 puffs into the lungs 2 (two) times daily.   folic acid (FOLVITE) 800 MCG tablet Take 800 mcg by mouth daily.   methotrexate (RHEUMATREX) 2.5 MG tablet Take 15 mg by mouth every Tuesday.   predniSONE (DELTASONE) 20 MG tablet Take 2 tablets (40 mg total) by mouth daily with breakfast for 5 days.   simvastatin (ZOCOR) 20 MG tablet Take 1 tablet (20 mg total) by mouth at bedtime.    sitaGLIPtin (JANUVIA) 100 MG tablet Take 1 tablet (100 mg total) by mouth daily.   zolpidem (AMBIEN) 10 MG tablet Take 1 tablet (10 mg total) by mouth at bedtime as needed for sleep.   amLODipine (NORVASC) 5 MG tablet Take 1 tablet (5 mg total) by mouth daily.   losartan (COZAAR) 100 MG tablet Take 1 tablet (100 mg total) by mouth daily. (Patient not taking: Reported on 06/13/2023)   No facility-administered encounter medications on file as of 06/13/2023.    No Known Allergies  Pertinent ROS per HPI, otherwise unremarkable      Objective:  BP 129/73   Pulse 90   Temp (!) 95.5 F (35.3  C) (Temporal)   Ht 5\' 7"  (1.702 m)   Wt 224 lb (101.6 kg)   SpO2 93%   BMI 35.08 kg/m    Wt Readings from Last 3 Encounters:  06/13/23 224 lb (101.6 kg)  04/20/23 222 lb (100.7 kg)  10/17/22 231 lb (104.8 kg)    Physical Exam Vitals and nursing note reviewed.  Constitutional:      General: She is not in acute distress.    Appearance: Normal appearance. She is obese. She is not ill-appearing, toxic-appearing or diaphoretic.  HENT:     Head: Normocephalic and atraumatic.     Jaw: There is normal jaw occlusion. Tenderness and pain on movement present. No trismus or malocclusion.     Salivary Glands: Right salivary gland is not diffusely enlarged or tender. Left salivary gland is not diffusely enlarged or tender.     Comments: Enlarged masseters bilaterally    Right Ear: Tympanic membrane, ear canal and external ear normal.     Left Ear: Tympanic membrane, ear canal and external ear normal.     Nose: Nose normal.     Mouth/Throat:     Mouth: Mucous membranes are moist.     Pharynx: Oropharynx is clear.  Eyes:     Conjunctiva/sclera: Conjunctivae normal.     Pupils: Pupils are equal, round, and reactive to light.  Cardiovascular:     Rate and Rhythm: Normal rate and regular rhythm.     Heart sounds: Normal heart sounds.  Pulmonary:     Effort: Pulmonary effort is normal.     Breath  sounds: Normal breath sounds.  Musculoskeletal:     Cervical back: Neck supple.  Skin:    General: Skin is warm and dry.     Capillary Refill: Capillary refill takes less than 2 seconds.  Neurological:     General: No focal deficit present.     Mental Status: She is alert and oriented to person, place, and time.  Psychiatric:        Mood and Affect: Mood normal.        Behavior: Behavior normal.        Thought Content: Thought content normal.        Judgment: Judgment normal.    Physical Exam   HEENT: Pain localized to the TMJ, suggestive of TMJ dysfunction. Masseter muscles enlarged, consistent with TMJ involvement. TMJ area appears edematous.        Results for orders placed or performed in visit on 04/20/23  Bayer DCA Hb A1c Waived  Result Value Ref Range   HB A1C (BAYER DCA - WAIVED) 6.3 (H) 4.8 - 5.6 %  CBC with Differential/Platelet  Result Value Ref Range   WBC 4.8 3.4 - 10.8 x10E3/uL   RBC 4.46 3.77 - 5.28 x10E6/uL   Hemoglobin 13.1 11.1 - 15.9 g/dL   Hematocrit 59.5 63.8 - 46.6 %   MCV 95 79 - 97 fL   MCH 29.4 26.6 - 33.0 pg   MCHC 30.8 (L) 31.5 - 35.7 g/dL   RDW 75.6 43.3 - 29.5 %   Platelets 232 150 - 450 x10E3/uL   Neutrophils 50 Not Estab. %   Lymphs 34 Not Estab. %   Monocytes 13 Not Estab. %   Eos 2 Not Estab. %   Basos 1 Not Estab. %   Neutrophils Absolute 2.4 1.4 - 7.0 x10E3/uL   Lymphocytes Absolute 1.6 0.7 - 3.1 x10E3/uL   Monocytes Absolute 0.6 0.1 - 0.9 x10E3/uL   EOS (  ABSOLUTE) 0.1 0.0 - 0.4 x10E3/uL   Basophils Absolute 0.0 0.0 - 0.2 x10E3/uL   Immature Granulocytes 0 Not Estab. %   Immature Grans (Abs) 0.0 0.0 - 0.1 x10E3/uL  CMP14+EGFR  Result Value Ref Range   Glucose 121 (H) 70 - 99 mg/dL   BUN 23 8 - 27 mg/dL   Creatinine, Ser 1.61 (H) 0.57 - 1.00 mg/dL   eGFR 45 (L) >09 UE/AVW/0.98   BUN/Creatinine Ratio 18 12 - 28   Sodium 142 134 - 144 mmol/L   Potassium 5.3 (H) 3.5 - 5.2 mmol/L   Chloride 102 96 - 106 mmol/L   CO2 24 20 - 29  mmol/L   Calcium 9.4 8.7 - 10.3 mg/dL   Total Protein 6.9 6.0 - 8.5 g/dL   Albumin 4.3 3.8 - 4.8 g/dL   Globulin, Total 2.6 1.5 - 4.5 g/dL   Bilirubin Total 0.3 0.0 - 1.2 mg/dL   Alkaline Phosphatase 108 44 - 121 IU/L   AST 31 0 - 40 IU/L   ALT 17 0 - 32 IU/L  Lipid panel  Result Value Ref Range   Cholesterol, Total 165 100 - 199 mg/dL   Triglycerides 119 (H) 0 - 149 mg/dL   HDL 38 (L) >14 mg/dL   VLDL Cholesterol Cal 45 (H) 5 - 40 mg/dL   LDL Chol Calc (NIH) 82 0 - 99 mg/dL   Chol/HDL Ratio 4.3 0.0 - 4.4 ratio       Pertinent labs & imaging results that were available during my care of the patient were reviewed by me and considered in my medical decision making.  Assessment & Plan:  Charnese was seen today for faical pain .  Diagnoses and all orders for this visit:  Bilateral temporomandibular joint pain -     predniSONE (DELTASONE) 20 MG tablet; Take 2 tablets (40 mg total) by mouth daily with breakfast for 5 days.     Assessment and Plan    Temporomandibular Joint Disorder (TMJ) Bilateral jaw pain exacerbated by chewing and talking, consistent with TMJ. Pain described as a crackling sensation. Physical examination reveals enlarged masseter muscles and tenderness at the temporomandibular joints. Differential diagnosis included trigeminal neuralgia, but bilateral nature and symptoms associated with jaw movement are more indicative of TMJ. Discussed treatment options including anti-inflammatory medications, jaw rest with soft foods, and potential referral to a maxillofacial surgeon or dentist if symptoms persist. Explained that anti-inflammatories work well for TMJ and that Botox injections into the masseter muscle may be considered if conservative measures fail. Informed about the use of prednisone due to elevated renal function and its anti-inflammatory properties. Emphasized the importance of follow-up if symptoms do not improve. - Prescribe prednisone for anti-inflammatory  properties due to elevated renal function - Recommend soft foods for the next month to allow jaw rest - Provide educational materials on TMJ - Advise follow-up if symptoms do not improve for potential referral to a specialist.          Continue all other maintenance medications.  Follow up plan: Return if symptoms worsen or fail to improve.   Continue healthy lifestyle choices, including diet (rich in fruits, vegetables, and lean proteins, and low in salt and simple carbohydrates) and exercise (at least 30 minutes of moderate physical activity daily).  Educational handout given for TMJ  The above assessment and management plan was discussed with the patient. The patient verbalized understanding of and has agreed to the management plan. Patient is aware to call the  clinic if they develop any new symptoms or if symptoms persist or worsen. Patient is aware when to return to the clinic for a follow-up visit. Patient educated on when it is appropriate to go to the emergency department.   Kari Baars, FNP-C [facility] [phone]

## 2023-06-19 ENCOUNTER — Other Ambulatory Visit: Payer: Self-pay | Admitting: Family Medicine

## 2023-06-19 DIAGNOSIS — R051 Acute cough: Secondary | ICD-10-CM

## 2023-06-23 ENCOUNTER — Other Ambulatory Visit: Payer: Self-pay | Admitting: Nurse Practitioner

## 2023-06-23 DIAGNOSIS — F5101 Primary insomnia: Secondary | ICD-10-CM

## 2023-06-23 DIAGNOSIS — R051 Acute cough: Secondary | ICD-10-CM

## 2023-07-03 ENCOUNTER — Ambulatory Visit (INDEPENDENT_AMBULATORY_CARE_PROVIDER_SITE_OTHER): Payer: Medicare Other

## 2023-07-03 VITALS — Ht 67.0 in | Wt 224.0 lb

## 2023-07-03 DIAGNOSIS — Z0000 Encounter for general adult medical examination without abnormal findings: Secondary | ICD-10-CM

## 2023-07-03 NOTE — Patient Instructions (Signed)
Tammy Hudson , Thank you for taking time to come for your Medicare Wellness Visit. I appreciate your ongoing commitment to your health goals. Please review the following plan we discussed and let me know if I can assist you in the future.   Referrals/Orders/Follow-Ups/Clinician Recommendations: Aim for 30 minutes of exercise or brisk walking, 6-8 glasses of water, and 5 servings of fruits and vegetables each day.  This is a list of the screening recommended for you and due dates:  Health Maintenance  Topic Date Due   Hepatitis C Screening  Never done   DEXA scan (bone density measurement)  03/16/2019   Eye exam for diabetics  06/21/2023   Yearly kidney health urinalysis for diabetes  07/16/2023   Complete foot exam   07/16/2023   COVID-19 Vaccine (5 - 2023-24 season) 07/17/2023   Hemoglobin A1C  10/18/2023   Yearly kidney function blood test for diabetes  04/19/2024   Medicare Annual Wellness Visit  07/02/2024   Mammogram  09/01/2024   DTaP/Tdap/Td vaccine (2 - Td or Tdap) 07/25/2028   Colon Cancer Screening  01/12/2029   Pneumonia Vaccine  Completed   Flu Shot  Completed   Zoster (Shingles) Vaccine  Completed   HPV Vaccine  Aged Out    Advanced directives: (ACP Link)Information on Advanced Care Planning can be found at [facility] [place] Advance Health Care Directives Advance Health Care Directives ([email or web address]   Next Medicare Annual Wellness Visit scheduled for next year: Yes

## 2023-07-03 NOTE — Progress Notes (Signed)
Subjective:   Tammy Hudson is a 72 y.o. female who presents for Medicare Annual (Subsequent) preventive examination.  Visit Complete: Virtual I connected with  Tammy Hudson on 07/03/23 by a audio enabled telemedicine application and verified that I am speaking with the correct person using two identifiers.  Patient Location: Home  Provider Location: Home Office  I discussed the limitations of evaluation and management by telemedicine. The patient expressed understanding and agreed to proceed.  Vital Signs: Because this visit was a virtual/telehealth visit, some criteria may be missing or patient reported. Any vitals not documented were not able to be obtained and vitals that have been documented are patient reported.  Cardiac Risk Factors include: advanced age (>19men, >32 women);hypertension;diabetes mellitus;dyslipidemia     Objective:    Today's Vitals   07/03/23 1135  Weight: 224 lb (101.6 kg)  Height: 5\' 7"  (1.702 m)   Body mass index is 35.08 kg/m.     07/03/2023   11:50 AM 03/01/2023    3:16 PM 06/27/2022    1:41 PM 10/31/2021    3:22 PM 11/16/2016    5:46 PM  Advanced Directives  Does Patient Have a Medical Advance Directive? No Yes No No No  Would patient like information on creating a medical advance directive? Yes (MAU/Ambulatory/Procedural Areas - Information given)  Yes (MAU/Ambulatory/Procedural Areas - Information given)  No - Patient declined    Current Medications (verified) Outpatient Encounter Medications as of 07/03/2023  Medication Sig   albuterol (PROVENTIL) (2.5 MG/3ML) 0.083% nebulizer solution Inhale 3 mLs into the lungs every 4 (four) hours as needed for shortness of breath.   ALPRAZolam (XANAX) 0.25 MG tablet Take 1 tablet (0.25 mg total) by mouth 2 (two) times daily as needed.   amLODipine (NORVASC) 5 MG tablet Take 1 tablet (5 mg total) by mouth daily.   benzonatate (TESSALON PERLES) 100 MG capsule Take 1 capsule (100 mg total) by mouth 3 (three)  times daily as needed for cough.   Cinnamon 500 MG capsule Take 500 mg by mouth daily.   cyclobenzaprine (FLEXERIL) 5 MG tablet Take 1 tablet (5 mg total) by mouth 3 (three) times daily as needed for muscle spasms.   escitalopram (LEXAPRO) 20 MG tablet Take 1 tablet (20 mg total) by mouth daily as needed (depression).   fluticasone-salmeterol (ADVAIR HFA) 115-21 MCG/ACT inhaler Inhale 2 puffs into the lungs 2 (two) times daily.   folic acid (FOLVITE) 800 MCG tablet Take 800 mcg by mouth daily.   methotrexate (RHEUMATREX) 2.5 MG tablet Take 15 mg by mouth every Tuesday.   simvastatin (ZOCOR) 20 MG tablet Take 1 tablet (20 mg total) by mouth at bedtime.   sitaGLIPtin (JANUVIA) 100 MG tablet Take 1 tablet (100 mg total) by mouth daily.   zolpidem (AMBIEN) 10 MG tablet Take 1 tablet (10 mg total) by mouth at bedtime as needed for sleep.   losartan (COZAAR) 100 MG tablet Take 1 tablet (100 mg total) by mouth daily. (Patient not taking: Reported on 06/13/2023)   No facility-administered encounter medications on file as of 07/03/2023.    Allergies (verified) Patient has no known allergies.   History: Past Medical History:  Diagnosis Date   Allergy    Anxiety    Arthritis    Cough variant asthma 11/05/2021   Depression    Diabetes mellitus without complication (HCC)    GERD (gastroesophageal reflux disease)    Hyperlipidemia    Hypertension    Neuromuscular disorder (HCC)  Past Surgical History:  Procedure Laterality Date   ABDOMINAL HYSTERECTOMY     COLONOSCOPY WITH PROPOFOL N/A 01/12/2022   Procedure: COLONOSCOPY WITH PROPOFOL;  Surgeon: Dolores Frame, MD;  [facility];  Service: Gastroenterology;  Laterality: N/A;  220 pre op done already   FLEXIBLE SIGMOIDOSCOPY  01/11/2022   Procedure: FLEXIBLE SIGMOIDOSCOPY;  Surgeon: Dolores Frame, MD;  Location: [facility];  Service: Gastroenterology;;   POLYPECTOMY  01/12/2022   Procedure: POLYPECTOMY  INTESTINAL;  Surgeon: Dolores Frame, MD;  [facility]: [facility];  Service: Gastroenterology;;   History reviewed. No pertinent family history. Social History   Socioeconomic History   Marital status: Married    Spouse name: Not on file   Number of children: Not on file   Years of education: Not on file   Highest education level: Not on file  Occupational History   Not on file  Tobacco Use   Smoking status: Former    Current packs/day: 0.00    Types: Cigarettes    Quit date: 04/02/1995    Years since quitting: 28.2   Smokeless tobacco: Never  Substance and Sexual Activity   Alcohol use: No    Alcohol/week: 0.0 standard drinks of alcohol   Drug use: No   Sexual activity: Not on file  Other Topics Concern   Not on file  Social History Narrative   Retired from the [employer]    2 children    Husband lives in [place]    Social Determinants of Health   Financial Resource Strain: Low Risk  (07/03/2023)   Overall Financial Resource Strain (CARDIA)    Difficulty of Paying Living Expenses: Not hard at all  Food Insecurity: No Food Insecurity (07/03/2023)   Hunger Vital Sign    Worried About Running Out of Food in the Last Year: Never true    Ran Out of Food in the Last Year: Never true  Transportation Needs: No Transportation Needs (07/03/2023)   PRAPARE - Administrator, Civil Service (Medical): No    Lack of Transportation (Non-Medical): No  Physical Activity: Insufficiently Active (07/03/2023)   Exercise Vital Sign    Days of Exercise per Week: 3 days    Minutes of Exercise per Session: 30 min  Stress: No Stress Concern Present (07/03/2023)   [employer] of Occupational Health - Occupational Stress Questionnaire    Feeling of Stress : Not at all  Social Connections: Socially Integrated (07/03/2023)   Social Connection and Isolation Panel [NHANES]    Frequency of Communication with Friends and Family: More than three times a week    Frequency of Social  Gatherings with Friends and Family: Three times a week    Attends Religious Services: More than 4 times per year    Active Member of Clubs or Organizations: Yes    Attends Engineer, structural: More than 4 times per year    Marital Status: Married    Tobacco Counseling Counseling given: Not Answered   Clinical Intake:  Pre-visit preparation completed: Yes  Pain : No/denies pain     Diabetes: Yes CBG done?: No Did pt. bring in CBG monitor from home?: No  How often do you need to have someone help you when you read instructions, pamphlets, or other written materials from your doctor or pharmacy?: 1 - Never  Interpreter Needed?: No  Information entered by :: Kandis Fantasia LPN   Activities of Daily Living    07/03/2023  11:49 AM  In your present state of health, do you have any difficulty performing the following activities:  Hearing? 0  Vision? 0  Difficulty concentrating or making decisions? 0  Walking or climbing stairs? 0  Dressing or bathing? 0  Doing errands, shopping? 0  Preparing Food and eating ? N  Using the Toilet? N  In the past six months, have you accidently leaked urine? N  Do you have problems with loss of bowel control? N  Managing your Medications? N  Managing your Finances? N  Housekeeping or managing your Housekeeping? N    Patient Care Team: Bennie Pierini, FNP as PCP - General (Family Medicine) Casimer Lanius, MD as Consulting Physician (Rheumatology) Nyoka Cowden, MD as Consulting Physician (Pulmonary Disease) Delora Fuel, OD (Optometry)  Indicate any recent Medical Services you may have received from other than [facility] providers in the past year (date may be approximate).     Assessment:   This is a routine wellness examination for Tammy Hudson.  Hearing/Vision screen Hearing Screening - Comments:: Denies hearing difficulties   Vision Screening - Comments:: Wears rx glasses - up to date with routine eye exams with  [facility]. Wyn Forster)     Goals Addressed             This Visit's Progress    Remain active and independent        Depression Screen    07/03/2023   11:48 AM 04/20/2023    2:13 PM 10/17/2022   12:29 PM 08/22/2022    2:53 PM 06/27/2022    1:35 PM 04/11/2022    2:45 PM 08/30/2016    3:24 PM  PHQ 2/9 Scores  PHQ - 2 Score 2 2 0 0 2 2 3   PHQ- 9 Score 8 11 0 4 7 7 5     Fall Risk    07/03/2023   11:49 AM 04/20/2023    2:13 PM 10/17/2022   12:29 PM 08/22/2022    2:53 PM 06/27/2022    1:24 PM  Fall Risk   Falls in the past year? 0 1 0 0 0  Number falls in past yr: 0 1  0 0  Injury with Fall? 0 0  0 0  Risk for fall due to : No Fall Risks History of fall(s)  No Fall Risks   Follow up Falls prevention discussed;Education provided;Falls evaluation completed Education provided  Falls evaluation completed Falls evaluation completed;Education provided;Falls prevention discussed    MEDICARE RISK AT HOME: Medicare Risk at Home Any stairs in or around the home?: No If so, are there any without handrails?: No Home free of loose throw rugs in walkways, pet beds, electrical cords, etc?: Yes Adequate lighting in your home to reduce risk of falls?: Yes Life alert?: No Use of a cane, walker or w/c?: No Grab bars in the bathroom?: Yes Shower chair or bench in shower?: No Elevated toilet seat or a handicapped toilet?: Yes  TIMED UP AND GO:  Was the test performed?  No    Cognitive Function:        07/03/2023   11:50 AM 06/27/2022    1:43 PM  6CIT Screen  What Year? 0 points 0 points  What month? 0 points 0 points  What time? 0 points 0 points  Count back from 20 0 points 0 points  Months in reverse 0 points 0 points  Repeat phrase 0 points 0 points  Total Score 0 points 0 points    Immunizations Immunization History  Administered Date(s) Administered   Fluad Quad(high Dose 65+) 05/04/2018   Influenza Split 05/12/2020   Influenza, High Dose Seasonal PF 06/07/2016, 05/04/2018    Influenza,inj,Quad PF,6+ Mos 05/24/2021   Influenza,trivalent, recombinat, inj, PF 05/12/2020   Influenza-Unspecified 06/07/2016, 05/04/2018, 04/12/2022, 05/22/2023   Moderna Covid-19 Fall Seasonal Vaccine 110yrs & older 05/22/2023   Moderna Sars-Covid-2 Vaccination 08/29/2019, 09/30/2019, 11/23/2020   Pneumococcal Conjugate-13 02/14/2017   Pneumococcal Polysaccharide-23 02/26/2018, 08/17/2019   RSV,unspecified 08/31/2022   Tdap 07/25/2018   Zoster Recombinant(Shingrix) 12/14/2017, 02/20/2018, 07/25/2018, 08/25/2018    TDAP status: Up to date  Flu Vaccine status: Up to date  Pneumococcal vaccine status: Up to date  Covid-19 vaccine status: Information provided on how to obtain vaccines.   Qualifies for Shingles Vaccine? Yes   Zostavax completed No   Shingrix Completed?: Yes  Screening Tests Health Maintenance  Topic Date Due   Hepatitis C Screening  Never done   DEXA SCAN  03/16/2019   OPHTHALMOLOGY EXAM  06/21/2023   Diabetic kidney evaluation - Urine ACR  07/16/2023   FOOT EXAM  07/16/2023   COVID-19 Vaccine (5 - 2023-24 season) 07/17/2023   HEMOGLOBIN A1C  10/18/2023   Diabetic kidney evaluation - eGFR measurement  04/19/2024   Medicare Annual Wellness (AWV)  07/02/2024   MAMMOGRAM  09/01/2024   DTaP/Tdap/Td (2 - Td or Tdap) 07/25/2028   Colonoscopy  01/12/2029   Pneumonia Vaccine 49+ Years old  Completed   INFLUENZA VACCINE  Completed   Zoster Vaccines- Shingrix  Completed   HPV VACCINES  Aged Out    Health Maintenance  Health Maintenance Due  Topic Date Due   Hepatitis C Screening  Never done   DEXA SCAN  03/16/2019   OPHTHALMOLOGY EXAM  06/21/2023   Diabetic kidney evaluation - Urine ACR  07/16/2023    Colorectal cancer screening: Type of screening: Colonoscopy. Completed 01/12/22. Repeat every 7 years  Mammogram status: Completed 09/01/22. Repeat every year  Bone Density status: Ordered today. Pt provided with contact info and advised to call to schedule  appt.  Lung Cancer Screening: (Low Dose CT Chest recommended if Age 36-80 years, 20 pack-year currently smoking OR have quit w/in 15years.) does not qualify.   Lung Cancer Screening Referral: n/a  Additional Screening:  Hepatitis C Screening: does qualify  Vision Screening: Recommended annual ophthalmology exams for early detection of glaucoma and other disorders of the eye. Is the patient up to date with their annual eye exam?  Yes  Who is the provider or what is the name of the office in which the patient attends annual eye exams? [facility].  If pt is not established with a provider, would they like to be referred to a provider to establish care? No .   Dental Screening: Recommended annual dental exams for proper oral hygiene  Diabetic Foot Exam: Diabetic Foot Exam: Completed 07/15/22  Community Resource Referral / Chronic Care Management: CRR required this visit?  No   CCM required this visit?  No     Plan:     I have personally reviewed and noted the following in the patient's chart:   Medical and social history Use of alcohol, tobacco or illicit drugs  Current medications and supplements including opioid prescriptions. Patient is not currently taking opioid prescriptions. Functional ability and status Nutritional status Physical activity Advanced directives List of other physicians Hospitalizations, surgeries, and ER visits in previous 12 months Vitals Screenings to include cognitive, depression, and falls Referrals and appointments  In addition, I  have reviewed and discussed with patient certain preventive protocols, quality metrics, and best practice recommendations. A written personalized care plan for preventive services as well as general preventive health recommendations were provided to patient.     Kandis Fantasia [place], [place]   16/12/628   After Visit Summary: (Mail) Due to this being a telephonic visit, the after visit summary with patients  personalized plan was offered to patient via mail   Nurse Notes: No concenrs at this time

## 2023-07-11 ENCOUNTER — Telehealth: Payer: Self-pay | Admitting: Family Medicine

## 2023-07-11 NOTE — Telephone Encounter (Signed)
Alprazolam was sent in on 04/20/23 #60, 5 refills.  Left message to call back

## 2023-07-11 NOTE — Telephone Encounter (Unsigned)
Copied from CRM [id number]. Topic: Clinical - Medication Refill >> Jul 11, 2023 12:04 PM Joanette Gula wrote: Most Recent Primary Care Visit:  Provider: Anthoney Harada  Department: [facility]  Visit Type: MEDICARE AWV, SEQUENTIAL  Date: 07/03/2023  Medication: ALPRAZolam (XANAX) 0.25 MG tablet  Has the patient contacted their pharmacy? Yes (Agent: If no, request that the patient contact the pharmacy for the refill. If patient does not wish to contact the pharmacy document the reason why and proceed with request.) (Agent: If yes, when and what did the pharmacy advise?)  Is this the correct pharmacy for this prescription? Yes If no, delete pharmacy and type the correct one.  This is the patient's preferred pharmacy:   [id number] - [place] - [address] [place] [address] Phone: [phone] Fax: [phone]   Has the prescription been filled recently? Yes  Is the patient out of the medication? Yes  Has the patient been seen for an appointment in the last year OR does the patient have an upcoming appointment? Yes  Can we respond through MyChart? No  Agent: Please be advised that Rx refills may take up to 3 business days. We ask that you follow-up with your pharmacy.

## 2023-07-17 ENCOUNTER — Telehealth: Payer: Self-pay

## 2023-07-17 DIAGNOSIS — M542 Cervicalgia: Secondary | ICD-10-CM

## 2023-07-17 MED ORDER — CYCLOBENZAPRINE HCL 5 MG PO TABS: 5.0000 mg | ORAL_TABLET | Freq: Three times a day (TID) | ORAL | 1 refills | Status: DC | PRN

## 2023-07-17 NOTE — Addendum Note (Signed)
Addended by: Bennie Pierini on: 07/17/2023 04:04 PM   Modules accepted: Orders

## 2023-07-17 NOTE — Telephone Encounter (Signed)
Do you want to refill? Please advise

## 2023-07-17 NOTE — Telephone Encounter (Signed)
Copied from CRM [id number]. Topic: Clinical - Prescription Issue >> Jul 17, 2023 10:44 AM Shelah Lewandowsky wrote: Reason for CRM: Patient states her prescription refill was denied for cyclobenzaprine [facility]) wants to know why. Will change doctors if she is not able to get her medication. Wants a call back [phone]

## 2023-08-15 ENCOUNTER — Other Ambulatory Visit: Payer: Self-pay | Admitting: Nurse Practitioner

## 2023-08-15 DIAGNOSIS — E1142 Type 2 diabetes mellitus with diabetic polyneuropathy: Secondary | ICD-10-CM

## 2023-08-21 ENCOUNTER — Other Ambulatory Visit: Payer: Self-pay | Admitting: Nurse Practitioner

## 2023-08-21 DIAGNOSIS — E1142 Type 2 diabetes mellitus with diabetic polyneuropathy: Secondary | ICD-10-CM

## 2023-08-21 MED ORDER — SITAGLIPTIN PHOSPHATE 100 MG PO TABS: 100.0000 mg | ORAL_TABLET | Freq: Every day | ORAL | 0 refills | Status: DC

## 2023-08-21 NOTE — Telephone Encounter (Signed)
Copied from CRM [id number]. Topic: Clinical - Medication Refill >> Aug 21, 2023  9:35 AM Fuller Mandril wrote: Most Recent Primary Care Visit:  Provider: Anthoney Harada  Department: [facility]  Visit Type: MEDICARE AWV, SEQUENTIAL  Date: 07/03/2023  Medication: sitaGLIPtin (JANUVIA) 100 MG tablet  Has the patient contacted their pharmacy? Yes (Agent: If no, request that the patient contact the pharmacy for the refill. If patient does not wish to contact the pharmacy document the reason why and proceed with request.) (Agent: If yes, when and what did the pharmacy advise?) requested and not received response. Advised shows sent 22nd - pharmacy states they have not received   Is this the correct pharmacy for this prescription? Yes If no, delete pharmacy and type the correct one.  This is the patient's preferred pharmacy:  [facility] - [place], [place] - [address] [place] [address] Phone: [phone] Fax: [phone]   Has the prescription been filled recently? Sent 1/22 not filled   Is the patient out of the medication? No - 2 left   Has the patient been seen for an appointment in the last year OR does the patient have an upcoming appointment? Yes  Can we respond through MyChart? No  Agent: Please be advised that Rx refills may take up to 3 business days. We ask that you follow-up with your pharmacy.

## 2023-09-13 ENCOUNTER — Other Ambulatory Visit (HOSPITAL_COMMUNITY): Payer: Self-pay | Admitting: Nurse Practitioner

## 2023-09-13 DIAGNOSIS — Z1231 Encounter for screening mammogram for malignant neoplasm of breast: Secondary | ICD-10-CM

## 2023-09-18 ENCOUNTER — Ambulatory Visit (HOSPITAL_COMMUNITY)
Admission: RE | Admit: 2023-09-18 | Discharge: 2023-09-18 | Disposition: A | Discharge status: Home / Self Care | Payer: Medicare Other | Source: Ambulatory Visit | Attending: Nurse Practitioner | Admitting: Nurse Practitioner

## 2023-09-18 DIAGNOSIS — Z1231 Encounter for screening mammogram for malignant neoplasm of breast: Secondary | ICD-10-CM | POA: Diagnosis present

## 2023-09-20 ENCOUNTER — Other Ambulatory Visit: Payer: Self-pay | Admitting: Nurse Practitioner

## 2023-09-20 DIAGNOSIS — E1169 Type 2 diabetes mellitus with other specified complication: Secondary | ICD-10-CM

## 2023-10-10 ENCOUNTER — Ambulatory Visit: Payer: Self-pay | Admitting: Nurse Practitioner

## 2023-10-10 NOTE — Telephone Encounter (Signed)
  Chief Complaint: neck pain Symptoms: middle back of neck pain radiates down back and left leg, left foot numbness Frequency: worsened x 1 week Pertinent Negatives: Patient denies weakness, loss of bowel or bladder control, chest pain, fever Disposition: [] ED /[] Urgent Care (no appt availability in office) / [x] Appointment(In office/virtual)/ []  [place] [facility]/ [] Home Care/ [] Refused Recommended Disposition /[] [facility]/ []  Follow-up with PCP Additional Notes: She states she has tried taking Tylenol, which has helped some. Patient states she already has an office visit on 10/16/23 with Gennette Pac but would like to know if she can get a muscle relaxer in the meantime. Patient agreeable to acute visit on Thursday and asking if her office visit can also be addressed at this time. Called [facility], suggest making acute appt on Thursday and state it is up to the provider if she would like to address both issues at that time.  Copied from CRM [id number]. Topic: Clinical - Red Word Triage >> Oct 10, 2023  1:39 PM Gaetano Hawthorne wrote: Red Word that prompted transfer to Nurse Triage: Patient states that she has been experiencing pain in her neck and down to her back - Patient states that it hurts to sit up. Reason for Disposition  [1] MODERATE neck pain (e.g., interferes with normal activities) AND [2] present > 3 days  Answer Assessment - Initial Assessment Questions 1. ONSET: "When did the pain begin?"      "Whole week, the week, for some time now".  2. LOCATION: "Where does it hurt?"      Middle back of neck.  3. PATTERN "Does the pain come and go, or has it been constant since it started?"      Constant.  4. SEVERITY: "How bad is the pain?"  (Scale 1-10; or mild, moderate, severe)   - NO PAIN (0): no pain or only slight stiffness    - MILD (1-3): doesn't interfere with normal activities    - MODERATE (4-7): interferes with normal activities or awakens from sleep    - SEVERE (8-10):   excruciating pain, unable to do any normal activities      5/10.  5. RADIATION: "Does the pain go anywhere else, shoot into your arms?"    Radiates down to back and left  leg.  6. CORD SYMPTOMS: "Any weakness or numbness of the arms or legs?"     Numbness in left foot.  7. CAUSE: "What do you think is causing the neck pain?"     Patient states she has rheumatoid arthritis and it can get better and then come back. She states she had an infusion recently for it.  8. NECK OVERUSE: "Any recent activities that involved turning or twisting the neck?"     Denies.  9. OTHER SYMPTOMS: "Do you have any other symptoms?" (e.g., headache, fever, chest pain, difficulty breathing, neck swelling)     Upper left leg/thigh swelling, intermittent SOB at night (patient states "that could by my bronchitis"), headache x couple of days  10. PREGNANCY: "Is there any chance you are pregnant?" "When was your last menstrual period?"       N/A.  Protocols used: Neck Pain or Stiffness-A-AH

## 2023-10-10 NOTE — Telephone Encounter (Signed)
 That will be fine.

## 2023-10-12 ENCOUNTER — Encounter: Payer: Self-pay | Admitting: Nurse Practitioner

## 2023-10-12 ENCOUNTER — Ambulatory Visit (INDEPENDENT_AMBULATORY_CARE_PROVIDER_SITE_OTHER): Admitting: Nurse Practitioner

## 2023-10-12 DIAGNOSIS — M542 Cervicalgia: Secondary | ICD-10-CM

## 2023-10-12 MED ORDER — DICLOFENAC SODIUM 1 % EX GEL: 4.0000 g | Freq: Four times a day (QID) | CUTANEOUS | 1 refills | Status: AC

## 2023-10-12 MED ORDER — CYCLOBENZAPRINE HCL 5 MG PO TABS: 5.0000 mg | ORAL_TABLET | Freq: Three times a day (TID) | ORAL | 1 refills | Status: DC | PRN

## 2023-10-12 NOTE — Patient Instructions (Signed)
 Cervical Sprain A cervical sprain is a stretch or tear in one or more of the ligaments in the neck. Ligaments are the tissues that connect bones to each other. Cervical sprains can range from mild to severe. Severe cervical sprains can cause the spinal bones (vertebrae) in the neck to be unstable. This can result in spinal cord damage and serious nervous system problems. Healing time for a cervical sprain depends on the cause and extent of the injury. Most cervical sprains heal in 4-6 weeks. What are the causes? Cervical sprains may be caused by trauma, such as an injury from a motor vehicle accident, a fall, or a sudden forward and backward whipping movement of the head and neck (whiplash injury). Mild cervical sprains may be caused by wear and tear over time. What increases the risk? You are more likely to get a cervical sprain if: You take part in activities that have a high risk of trauma to the neck. These include contact sports, gymnastics, and diving. You have: Osteoarthritis of the spine. Poor strength and flexibility of the neck. Poor posture. You have had a neck injury in the past. You spend long periods in positions that put stress on the neck, such as sitting at a computer. What are the signs or symptoms? Symptoms of this condition include: Any of these problems in the neck, shoulders, or upper back: Pain or tenderness. Stiffness. Swelling. A burning feeling. Sudden tightening of neck muscles (spasms). Limited ability to move the neck. Headache. Dizziness. Nausea or vomiting. Weakness, numbness, or tingling in a hand or an arm. Symptoms may develop right away after injury or may develop over a few days. In some cases, symptoms may go away with treatment and return (recur) over time. How is this diagnosed? This condition may be diagnosed based on: Your symptoms, medical history, and a physical exam. Any recent injuries or known neck problems that you have, such as arthritis  in the neck. Imaging tests, such as X-rays, an MRI, or a CT scan. How is this treated? This condition is treated by resting and icing the injured area and doing physical therapy exercises to improve movement and strength. Heat therapy may be used 2-3 days after the injury if there is no swelling. Depending on the severity of your condition, treatment may also include: Keeping your neck in place (immobilized) for periods of time. This may be done using: A cervical collar. This supports your chin and the back of your head. A cervical traction device. This is a sling that holds up your head. It removes weight and pressure from your neck. Medicines for pain or other symptoms. Surgery. This is rare. Follow these instructions at home: Medicines Take over-the-counter and prescription medicines only as told by your health care provider. Ask your provider if the medicine prescribed to you: Requires you to avoid driving or using machinery. Can cause constipation. You may need to take these actions to prevent or treat constipation: Drink enough fluid to keep your pee pale yellow. Take over-the-counter or prescription medicines. Eat foods that are high in fiber, such as beans, whole grains, and fresh fruits and vegetables. Limit foods that are high in fat and processed sugars, such as fried or sweet foods. If you have a cervical collar: Wear the collar as told by your provider. Do not remove it unless told. Ask before making any adjustments to your collar. If you have long hair, keep it outside of the collar. If you are allowed to remove the  collar for cleaning and bathing: Follow instructions about how to remove it safely. Clean it by hand with mild soap and water and air-dry it completely. If your collar has removable pads, remove them every 1-2 days and wash them by hand with soap and water. Let them air-dry completely before putting them back in the collar. Tell your provider if your skin under  the collar has irritation or sores. Managing pain, stiffness, and swelling     Use a cervical traction device as told. If told, put ice on the affected area. Put ice in a plastic bag. Place a towel between your skin and the bag. Leave the ice on for 20 minutes, 2-3 times a day. If told, apply heat to the affected area before you exercise or as often as told by your provider. Use the heat source that your provider recommends, such as a moist heat pack or a heating pad. Place a towel between your skin and the heat source. Leave the heat on for 20-30 minutes. If your skin turns bright red, remove the ice or heat right away to prevent skin damage. The risk of damage is higher if you cannot feel pain, heat, or cold. Activity Do not drive while wearing a cervical collar. If you do not have a cervical collar, ask if it is safe to drive while your neck heals. Do not lift anything that is heavier than 10 lb (4.5 kg) until your provider says that it is safe. Rest as told by your provider. Avoid positions and activities that make your symptoms worse. Do physical therapy exercises as told by your provider or physical therapist. Return to your normal activities as told by your provider. Ask your provider what activities are safe for you. General instructions Do not use any products that contain nicotine or tobacco. These products include cigarettes, chewing tobacco, and vaping devices, such as e-cigarettes. These can delay healing. If you need help quitting, ask your provider. Keep all follow-up visits. Your provider will monitor your injury and activity level. How is this prevented? To prevent a cervical sprain from happening again: Use and maintain good posture. Make any needed adjustments to your workstation to help you do this. Exercise regularly as told by your provider or physical therapist. Avoid risky activities that may cause a cervical sprain. Contact a health care provider if: You have  symptoms that get worse or do not get better after 2 weeks of treatment. You have new symptoms. Your pain gets worse or does not get better with medicine. You have sores or irritated skin on your neck from wearing your cervical collar. Get help right away if: You have severe pain. You develop numbness, tingling, or weakness in any part of your body. You cannot move a part of your body (you have paralysis). You have neck pain along with severe dizziness or headache. This information is not intended to replace advice given to you by your health care provider. Make sure you discuss any questions you have with your health care provider. Document Revised: 02/11/2022 Document Reviewed: 02/11/2022 Elsevier Patient Education  2024 [employer].

## 2023-10-12 NOTE — Progress Notes (Signed)
   Subjective:    Patient ID: Tammy Hudson, female    DOB: 12-01-1950, 73 y.o.   MRN: [id number]  Chief Complaint: neck pain  Neck Pain  This is a new problem. The current episode started 1 to 4 weeks ago. The problem occurs 2 to 4 times per day. The problem has been waxing and waning. The pain is associated with nothing. The quality of the pain is described as aching. The pain is at a severity of 5/10. The pain is mild. Nothing aggravates the symptoms. Pertinent negatives include no headaches.    Patient Active Problem List   Diagnosis Date Noted   Cough variant asthma 11/05/2021   Left-sided low back pain with left-sided sciatica 02/15/2016   Hyperlipidemia associated with type 2 diabetes mellitus (HCC) 01/29/2016   Vitamin D deficiency 01/29/2016   Essential hypertension 04/02/2015   Type 2 diabetes mellitus with peripheral neuropathy (HCC) 04/02/2015   Allergic rhinitis 04/02/2015       Review of Systems  Musculoskeletal:  Positive for neck pain.  Neurological:  Negative for headaches.       Objective:   Physical Exam Constitutional:      Appearance: Normal appearance.  Cardiovascular:     Rate and Rhythm: Normal rate and regular rhythm.     Heart sounds: Normal heart sounds.  Pulmonary:     Effort: Pulmonary effort is normal.     Breath sounds: Normal breath sounds.  Skin:    General: Skin is warm.  Neurological:     General: No focal deficit present.     Mental Status: She is alert and oriented to person, place, and time.  Psychiatric:        Mood and Affect: Mood normal.        Behavior: Behavior normal.    BP 137/74   Pulse 81   Temp (!) 97.4 F (36.3 C)   Ht 5\' 7"  (1.702 m)   Wt 225 lb 6.4 oz (102.2 kg)   SpO2 93%   BMI 35.30 kg/m         Assessment & Plan:   Tammy Hudson in today with chief complaint of Neck Pain (Pain in neck started 2 weeks ago. No medication is helping. Patient is wanting to know if an antiinflammatory would help at  all.)   1. Neck pain Tens unit form [employer] daily or as needed Moist heat Rest' stretches daily   Meds ordered this encounter  Medications   cyclobenzaprine (FLEXERIL) 5 MG tablet    Sig: Take 1 tablet (5 mg total) by mouth 3 (three) times daily as needed for muscle spasms.    Dispense:  30 tablet    Refill:  1    Supervising Provider:   Arville Care A [[id number]]   diclofenac Sodium (VOLTAREN) 1 % GEL    Sig: Apply 4 g topically 4 (four) times daily.    Dispense:  350 g    Refill:  1    Supervising Provider:   Arville Care A [[id number]]     The above assessment and management plan was discussed with the patient. The patient verbalized understanding of and has agreed to the management plan. Patient is aware to call the clinic if symptoms persist or worsen. Patient is aware when to return to the clinic for a follow-up visit. Patient educated on when it is appropriate to go to the emergency department.   Mary-Margaret Daphine Deutscher, FNP

## 2023-10-16 ENCOUNTER — Encounter: Payer: Self-pay | Admitting: Nurse Practitioner

## 2023-10-16 ENCOUNTER — Ambulatory Visit: Payer: Medicare Other | Admitting: Nurse Practitioner

## 2023-10-16 VITALS — BP 157/97 | HR 83 | Temp 97.6°F | Ht 67.0 in | Wt 225.0 lb

## 2023-10-16 DIAGNOSIS — E1142 Type 2 diabetes mellitus with diabetic polyneuropathy: Secondary | ICD-10-CM | POA: Diagnosis not present

## 2023-10-16 DIAGNOSIS — I10 Essential (primary) hypertension: Secondary | ICD-10-CM

## 2023-10-16 DIAGNOSIS — E785 Hyperlipidemia, unspecified: Secondary | ICD-10-CM

## 2023-10-16 DIAGNOSIS — E1169 Type 2 diabetes mellitus with other specified complication: Secondary | ICD-10-CM

## 2023-10-16 DIAGNOSIS — F5101 Primary insomnia: Secondary | ICD-10-CM

## 2023-10-16 DIAGNOSIS — F411 Generalized anxiety disorder: Secondary | ICD-10-CM

## 2023-10-16 DIAGNOSIS — E559 Vitamin D deficiency, unspecified: Secondary | ICD-10-CM | POA: Diagnosis not present

## 2023-10-16 DIAGNOSIS — Z7984 Long term (current) use of oral hypoglycemic drugs: Secondary | ICD-10-CM

## 2023-10-16 DIAGNOSIS — K219 Gastro-esophageal reflux disease without esophagitis: Secondary | ICD-10-CM

## 2023-10-16 LAB — BAYER DCA HB A1C WAIVED: HB A1C (BAYER DCA - WAIVED): 6.2 % — ABNORMAL HIGH (ref 4.8–5.6)

## 2023-10-16 MED ORDER — PANTOPRAZOLE SODIUM 40 MG PO TBEC: 40.0000 mg | DELAYED_RELEASE_TABLET | Freq: Every day | ORAL | 1 refills | Status: DC

## 2023-10-16 MED ORDER — SITAGLIPTIN PHOSPHATE 100 MG PO TABS: 100.0000 mg | ORAL_TABLET | Freq: Every day | ORAL | 1 refills | Status: DC

## 2023-10-16 MED ORDER — AMLODIPINE BESYLATE 5 MG PO TABS: 5.0000 mg | ORAL_TABLET | Freq: Every day | ORAL | 1 refills | Status: DC

## 2023-10-16 MED ORDER — LOSARTAN POTASSIUM 100 MG PO TABS: 100.0000 mg | ORAL_TABLET | Freq: Every day | ORAL | 1 refills | Status: DC

## 2023-10-16 MED ORDER — ZOLPIDEM TARTRATE 10 MG PO TABS: 10.0000 mg | ORAL_TABLET | Freq: Every evening | ORAL | 5 refills | Status: DC | PRN

## 2023-10-16 MED ORDER — ALPRAZOLAM 0.25 MG PO TABS: 0.2500 mg | ORAL_TABLET | Freq: Two times a day (BID) | ORAL | 5 refills | Status: DC | PRN

## 2023-10-16 MED ORDER — ESCITALOPRAM OXALATE 20 MG PO TABS: 20.0000 mg | ORAL_TABLET | Freq: Every day | ORAL | 1 refills | Status: DC | PRN

## 2023-10-16 MED ORDER — SIMVASTATIN 20 MG PO TABS: 20.0000 mg | ORAL_TABLET | Freq: Every day | ORAL | 1 refills | Status: DC

## 2023-10-16 NOTE — Progress Notes (Signed)
 Subjective:    Patient ID: Tammy Hudson, female    DOB: 22-Dec-1950, 73 y.o.   MRN: [id number]   Chief Complaint: medical management of chronic issues     HPI:  Tammy Hudson is a 73 y.o. who identifies as a female who was assigned female at birth.   Social history: Lives with: by herself Work history: retired   Water engineer in today for follow up of the following chronic medical issues:  1. Essential hypertension No c/o chest pain, sob or headache. Patient stopped her losartan and has been doubling up on Norvasc and she says that works better for her. Blood pressure reading at home have been running since changing her meds. BP Readings from Last 3 Encounters:  10/12/23 137/74  06/13/23 129/73  04/20/23 (!) 138/90      2. Hyperlipidemia associated with type 2 diabetes mellitus (HCC) Does try  to watch diet. No dedicated exercise. Lab Results  Component Value Date   CHOL 165 04/20/2023   HDL 38 (L) 04/20/2023   LDLCALC 82 04/20/2023   TRIG 273 (H) 04/20/2023   CHOLHDL 4.3 04/20/2023      3. Type 2 diabetes mellitus with peripheral neuropathy (HCC) Denies any sores on her feet. Has numbness in toes  4. Diabetes mellitus treated with oral medication (HCC) Does not check blood sugar at home on a regular basis. She says she does try to watch her diet. Lab Results  Component Value Date   HGBA1C 6.9 (H) 10/17/2022    5. Vitamin D deficiency Daily vitamin d supplement   New complaints: Has been having acid reflux symptoms and would like something to treat it.  No Known Allergies Outpatient Encounter Medications as of 10/16/2023  Medication Sig   albuterol (PROVENTIL) (2.5 MG/3ML) 0.083% nebulizer solution Inhale 3 mLs into the lungs every 4 (four) hours as needed for shortness of breath.   ALPRAZolam (XANAX) 0.25 MG tablet Take 1 tablet (0.25 mg total) by mouth 2 (two) times daily as needed.   amLODipine (NORVASC) 5 MG tablet Take 1 tablet (5 mg total) by mouth daily.    benzonatate (TESSALON PERLES) 100 MG capsule Take 1 capsule (100 mg total) by mouth 3 (three) times daily as needed for cough.   Cinnamon 500 MG capsule Take 500 mg by mouth daily.   cyclobenzaprine (FLEXERIL) 5 MG tablet Take 1 tablet (5 mg total) by mouth 3 (three) times daily as needed for muscle spasms.   diclofenac Sodium (VOLTAREN) 1 % GEL Apply 4 g topically 4 (four) times daily.   escitalopram (LEXAPRO) 20 MG tablet Take 1 tablet (20 mg total) by mouth daily as needed (depression).   fluticasone-salmeterol (ADVAIR HFA) 115-21 MCG/ACT inhaler Inhale 2 puffs into the lungs 2 (two) times daily.   folic acid (FOLVITE) 800 MCG tablet Take 800 mcg by mouth daily.   losartan (COZAAR) 100 MG tablet Take 1 tablet (100 mg total) by mouth daily.   methotrexate (RHEUMATREX) 2.5 MG tablet Take 15 mg by mouth every Tuesday.   simvastatin (ZOCOR) 20 MG tablet TAKE 1 TABLET BY MOUTH EVERYDAY AT BEDTIME   sitaGLIPtin (JANUVIA) 100 MG tablet Take 1 tablet (100 mg total) by mouth daily.   zolpidem (AMBIEN) 10 MG tablet Take 1 tablet (10 mg total) by mouth at bedtime as needed for sleep.   No facility-administered encounter medications on file as of 10/16/2023.    Past Surgical History:  Procedure Laterality Date   ABDOMINAL HYSTERECTOMY     COLONOSCOPY  WITH PROPOFOL N/A 01/12/2022   Procedure: COLONOSCOPY WITH PROPOFOL;  Surgeon: Dolores Frame, MD;  Location: [facility];  Service: Gastroenterology;  Laterality: N/A;  220 pre op done already   FLEXIBLE SIGMOIDOSCOPY  01/11/2022   Procedure: FLEXIBLE SIGMOIDOSCOPY;  Surgeon: Dolores Frame, MD;  Location: [facility];  Service: Gastroenterology;;   POLYPECTOMY  01/12/2022   Procedure: POLYPECTOMY INTESTINAL;  Surgeon: Dolores Frame, MD;  Location: [facility];  Service: Gastroenterology;;    No family history on file.    Controlled substance contract: n/a     Review of Systems  Constitutional:   Negative for diaphoresis.  Eyes:  Negative for pain.  Respiratory:  Negative for shortness of breath.   Cardiovascular:  Negative for chest pain, palpitations and leg swelling.  Gastrointestinal:  Negative for abdominal pain.  Endocrine: Negative for polydipsia.  Skin:  Negative for rash.  Neurological:  Negative for dizziness, weakness and headaches.  Hematological:  Does not bruise/bleed easily.  All other systems reviewed and are negative.      Objective:   Physical Exam Vitals and nursing note reviewed.  Constitutional:      General: She is not in acute distress.    Appearance: Normal appearance. She is well-developed.  HENT:     Head: Normocephalic.     Right Ear: Tympanic membrane normal.     Left Ear: Tympanic membrane normal.     Nose: Nose normal.     Mouth/Throat:     Mouth: Mucous membranes are moist.  Eyes:     Pupils: Pupils are equal, round, and reactive to light.  Neck:     Vascular: No carotid bruit or JVD.  Cardiovascular:     Rate and Rhythm: Normal rate and regular rhythm.     Heart sounds: Normal heart sounds.  Pulmonary:     Effort: Pulmonary effort is normal. No respiratory distress.     Breath sounds: Normal breath sounds. No wheezing or rales.  Chest:     Chest wall: No tenderness.  Abdominal:     General: Bowel sounds are normal. There is no distension or abdominal bruit.     Palpations: Abdomen is soft. There is no hepatomegaly, splenomegaly, mass or pulsatile mass.     Tenderness: There is no abdominal tenderness.  Musculoskeletal:        General: Normal range of motion.     Cervical back: Normal range of motion and neck supple.  Lymphadenopathy:     Cervical: No cervical adenopathy.  Skin:    General: Skin is warm and dry.  Neurological:     Mental Status: She is alert and oriented to person, place, and time.     Deep Tendon Reflexes: Reflexes are normal and symmetric.  Psychiatric:        Behavior: Behavior normal.        Thought  Content: Thought content normal.        Judgment: Judgment normal.     BP (!) 157/97   Pulse 83   Temp 97.6 F (36.4 C) (Temporal)   Ht 5\' 7"  (1.702 m)   Wt 225 lb (102.1 kg)   SpO2 95%   BMI 35.24 kg/m    Hgba1c discussed at appointment 6.2%     Assessment & Plan:   Tammy Hudson comes in today with chief complaint of No chief complaint on file.   Diagnosis and orders addressed:  1. Essential hypertension Low sodium diet - CBC with Differential/Platelet -  CMP14+EGFR - amLODipine (NORVASC) 5 MG tablet; Take 1 tablet (5 mg total) by mouth daily.  Dispense: 90 tablet; Refill: 1 - losartan (COZAAR) 100 MG tablet; Take 1 tablet (100 mg total) by mouth daily.  Dispense: 90 tablet; Refill: 1  2. Hyperlipidemia associated with type 2 diabetes mellitus (HCC) Low fat diet - Lipid panel - simvastatin (ZOCOR) 20 MG tablet; Take 1 tablet (20 mg total) by mouth at bedtime.  Dispense: 90 tablet; Refill: 1  3. Type 2 diabetes mellitus with peripheral neuropathy (HCC) Check feet daily  4. Diabetes mellitus treated with oral medication (HCC) Continue to watch carbs in diet - Bayer DCA Hb A1c Waived - sitaGLIPtin (JANUVIA) 100 MG tablet; Take 1 tablet (100 mg total) by mouth daily.  Dispense: 90 tablet; Refill: 1   5. Vitamin D deficiency Continue vitamin d supplement  6. GAD (generalized anxiety disorder) Stress management - escitalopram (LEXAPRO) 20 MG tablet; Take 1 tablet (20 mg total) by mouth daily as needed (depression).  Dispense: 90 tablet; Refill: 1 - ALPRAZolam (XANAX) 0.25 MG tablet; Take 1 tablet (0.25 mg total) by mouth 2 (two) times daily as needed.  Dispense: 60 tablet; Refill: 5  7. Primary insomnia Bedtime routine - zolpidem (AMBIEN) 10 MG tablet; Take 1 tablet (10 mg total) by mouth at bedtime as needed for sleep.  Dispense: 30 tablet; Refill: 5  8 GERD Avoid spicy foods Do not eat 2 hours prior to bedtime Start on protonix 40mg  daily  Labs  pending Health Maintenance reviewed Diet and exercise encouraged  Follow up plan: 6 months   Mary-Margaret Daphine Deutscher, FNP

## 2023-10-16 NOTE — Patient Instructions (Signed)
 GERD in Adults: Diet Changes When you have gastroesophageal reflux disease (GERD), you may need to make changes to your diet. Choosing the right foods can help with your symptoms. Think about working with an expert in healthy eating called a dietitian. They can help you make healthy food choices. What are tips for following this plan? Reading food labels Look for foods that are low in saturated fat. Foods that may help with your symptoms include: Foods with less than 5% of daily value (DV) of fat. Foods with 0 grams of trans fat. Cooking [employer] in ways that don't use a lot of fat. These ways include: Baking. Steaming. Grilling. Broiling. To add flavor, try to use herbs that are low in spice and acidity. Avoid frying your food. Meal planning  Eat small meals often rather than eating 3 large meals each day. Eat your meals slowly in a place where you feel relaxed. If told by your health care provider, avoid: Foods that cause symptoms. Keep a food diary to keep track of foods that cause symptoms. Alcohol. Drinking a lot of liquid with meals. General instructions For 2-3 hours after you eat, avoid: Bending over. Exercise. Lying down. Chew sugar-free gum after meals. What foods should I eat? Eat a healthy diet. Try to include: Foods with high amounts of fiber. These include: Fruits and vegetables. Whole grains and beans. Low-fat dairy products. Lean meats, fish, and poultry. Egg whites. Foods that cause symptoms in someone else may not cause symptoms for you. Work with your provider to find foods that are safe for you. The items listed above may not be all the foods and drinks you can have. Talk with a dietitian to learn more. The items listed above may not be a complete list of foods and beverages you can eat and drink. Contact a dietitian for more information. What foods should I avoid? Limiting some of these foods may help with your symptoms. Each person is different.  Talk with a dietitian or your provider to help you find the exact foods to avoid. Some of the foods to avoid may include: Fruits Fruits with a lot of acid in them. These may include citrus fruits, such as oranges, grapefruit, pineapple, and lemons. Vegetables Deep-fried vegetables, such as [place] fries. Vegetables, sauces, or toppings made with added fat and vegetables with acid in them. These may include tomatoes and tomato products, chili peppers, onions, garlic, and horseradish. Grains Pastries or quick breads with added fat. Meats and other proteins High-fat meats, such as fatty beef or pork, hot dogs, ribs, ham, sausage, salami, and bacon. Fried meat or protein, such as fried fish and fried chicken. Egg yolks. Fats and oils Butter. Margarine. Shortening. Ghee. Drinks Coffee and other drinks with caffeine in them. Fizzy and sugary drinks, such as soda and energy drinks. Fruit juice made with acidic fruits, such as orange or grapefruit. Tomato juice. Sweets and desserts Chocolate and cocoa. Donuts. Seasonings and condiments Mint, such as peppermint and spearmint. Condiments, herbs, or seasonings that cause symptoms. These may include curry, hot sauce, or vinegar-based salad dressings. The items listed above may not be all the foods and drinks you should avoid. Talk with a dietitian to learn more. Questions to ask your health care provider Changes to your diet and everyday life are often the first steps taken to manage symptoms of GERD. If these changes don't help, talk with your provider about taking medicines. Where to find more information [employer] G[employer]intestinal [employer]:  aboutgerd.org This information is not intended to replace advice given to you by your health care provider. Make sure you discuss any questions you have with your health care provider. Document Revised: 05/23/2023 Document Reviewed: 12/07/2022 Elsevier Patient Education  2024 [employer].

## 2023-10-17 ENCOUNTER — Telehealth: Payer: Self-pay

## 2023-10-17 ENCOUNTER — Other Ambulatory Visit

## 2023-10-17 ENCOUNTER — Other Ambulatory Visit (HOSPITAL_COMMUNITY): Payer: Self-pay

## 2023-10-17 LAB — CBC WITH DIFFERENTIAL/PLATELET
Basophils Absolute: 0 10*3/uL (ref 0.0–0.2)
Basos: 1 %
EOS (ABSOLUTE): 0.1 10*3/uL (ref 0.0–0.4)
Eos: 2 %
Hematocrit: 41 % (ref 34.0–46.6)
Hemoglobin: 12.9 g/dL (ref 11.1–15.9)
Immature Grans (Abs): 0 10*3/uL (ref 0.0–0.1)
Immature Granulocytes: 1 %
Lymphocytes Absolute: 1.7 10*3/uL (ref 0.7–3.1)
Lymphs: 27 %
MCH: 29.5 pg (ref 26.6–33.0)
MCHC: 31.5 g/dL (ref 31.5–35.7)
MCV: 94 fL (ref 79–97)
Monocytes Absolute: 0.8 10*3/uL (ref 0.1–0.9)
Monocytes: 13 %
Neutrophils Absolute: 3.7 10*3/uL (ref 1.4–7.0)
Neutrophils: 56 %
Platelets: 254 10*3/uL (ref 150–450)
RBC: 4.37 x10E6/uL (ref 3.77–5.28)
RDW: 13.5 % (ref 11.7–15.4)
WBC: 6.4 10*3/uL (ref 3.4–10.8)

## 2023-10-17 LAB — CMP14+EGFR
ALT: 9 IU/L (ref 0–32)
AST: 17 IU/L (ref 0–40)
Albumin: 4.3 g/dL (ref 3.8–4.8)
Alkaline Phosphatase: 103 IU/L (ref 44–121)
BUN/Creatinine Ratio: 16 (ref 12–28)
BUN: 19 mg/dL (ref 8–27)
Bilirubin Total: 0.2 mg/dL (ref 0.0–1.2)
CO2: 24 mmol/L (ref 20–29)
Calcium: 9.6 mg/dL (ref 8.7–10.3)
Chloride: 102 mmol/L (ref 96–106)
Creatinine, Ser: 1.21 mg/dL — ABNORMAL HIGH (ref 0.57–1.00)
Globulin, Total: 2.4 g/dL (ref 1.5–4.5)
Glucose: 78 mg/dL (ref 70–99)
Potassium: 5.1 mmol/L (ref 3.5–5.2)
Sodium: 142 mmol/L (ref 134–144)
Total Protein: 6.7 g/dL (ref 6.0–8.5)
eGFR: 48 mL/min/{1.73_m2} — ABNORMAL LOW (ref >59)

## 2023-10-17 LAB — LIPID PANEL
Chol/HDL Ratio: 3.8 ratio (ref 0.0–4.4)
Cholesterol, Total: 198 mg/dL (ref 100–199)
HDL: 52 mg/dL (ref >39)
LDL Chol Calc (NIH): 119 mg/dL — ABNORMAL HIGH (ref 0–99)
Triglycerides: 155 mg/dL — ABNORMAL HIGH (ref 0–149)
VLDL Cholesterol Cal: 27 mg/dL (ref 5–40)

## 2023-10-17 NOTE — Telephone Encounter (Signed)
 Pharmacy Patient Advocate Encounter   Received notification from Onbase that prior authorization for Cyclobenzaprine HCl 5MG  tablets is required/requested.   Insurance verification completed.   The patient is insured through  [facility]  .   Per test claim: PA required; PA submitted to above mentioned insurance via CoverMyMeds Key/confirmation #/EOC [id number] Status is pending

## 2023-10-18 ENCOUNTER — Telehealth: Payer: Self-pay | Admitting: Family Medicine

## 2023-10-18 ENCOUNTER — Telehealth: Payer: Self-pay | Admitting: Nurse Practitioner

## 2023-10-18 LAB — MICROALBUMIN / CREATININE URINE RATIO
Creatinine, Urine: 257.2 mg/dL
Microalb/Creat Ratio: 3 mg/g{creat} (ref 0–29)
Microalbumin, Urine: 8.8 ug/mL

## 2023-10-18 MED ORDER — ATORVASTATIN CALCIUM 40 MG PO TABS
40.0000 mg | ORAL_TABLET | Freq: Every day | ORAL | 1 refills | Status: DC
Start: 2023-10-18 — End: 2024-01-16

## 2023-10-18 NOTE — Addendum Note (Signed)
 Addended by: Bennie Pierini on: 10/18/2023 11:05 AM   Modules accepted: Orders

## 2023-10-18 NOTE — Telephone Encounter (Unsigned)
 Copied from CRM [id number]. Topic: Clinical - Medication Question >> Oct 18, 2023 12:02 PM Victorino Dike T wrote: Reason for CRM: Patient asking if she is supposed to stop taking simvastatin (ZOCOR) 20 MG tablet and start the new medication? Please call [phone] or [phone]

## 2023-10-18 NOTE — Telephone Encounter (Unsigned)
 Copied from CRM [id number]. Topic: General - Other >> Oct 18, 2023 10:10 AM Shelah Lewandowsky wrote: Reason for CRM: Patient returning call about labs- [phone]

## 2023-10-19 ENCOUNTER — Other Ambulatory Visit (HOSPITAL_COMMUNITY): Payer: Self-pay

## 2023-10-19 NOTE — Telephone Encounter (Signed)
 Yes stop simvastatin and start lipitor- let me know if have any problems

## 2023-10-19 NOTE — Telephone Encounter (Signed)
 Patient notified

## 2023-10-19 NOTE — Telephone Encounter (Signed)
 Pharmacy Patient Advocate Encounter  Received notification from [employer] that Prior Authorization for Cyclobenzaprine HCl 5MG  tablets has been APPROVED from 10/03/23 to 07/24/98. Ran test claim, Copay is $0.30. This test claim was processed through [facility]- copay amounts may vary at other pharmacies due to pharmacy/plan contracts, or as the patient moves through the different stages of their insurance plan.   PA #/Case ID/Reference #: [id number]

## 2023-11-01 DIAGNOSIS — K118 Other diseases of salivary glands: Secondary | ICD-10-CM

## 2023-11-07 ENCOUNTER — Ambulatory Visit: Payer: Self-pay

## 2023-11-07 NOTE — Telephone Encounter (Signed)
 Chief Complaint: hypoglycemia Symptoms: nausea, poor appetite, intermittent palpitations or SOB x 2 days (not present at this time) Frequency: x today Pertinent Negatives: Patient denies chest pain, SOB at present, vomiting Disposition: [] ED /[] Urgent Care (no appt availability in office) / [] Appointment(In office/virtual)/ []  [facility]/ [x] Home Care/ [] Refused Recommended Disposition /[] [facility]/ []  Follow-up with PCP Additional Notes: Patient calling in states she just checked her blood sugar and it is 65. Patient states she has not yet eaten today. Patient states she is getting ready to eat. Advised she drink fruit juice or milk and start eating and we can have her recheck her BG in 15 minutes (3:10pm). Called patient back at 3:10pm and she states she had just drank juice and was still eating and would recheck in 10 minutes. Called patient back later, her husband helped her check and her blood glucose 89. She states she feels at baseline and is asymptomatic. Advised her to call for any new or worsening symptoms. Offered visit with PCP clinic this week if she would like to discuss with provider and she refused but states she would like her PCP to be aware of the episode today.  Copied from CRM [id number]. Topic: Clinical - Red Word Triage >> Nov 07, 2023  2:45 PM Clayton Bibles wrote: Red Word that prompted transfer to Nurse Triage:  Husband just checked her blood sugar and it was at 65. She want to know what to do. Reason for Disposition  [1] Blood glucose 70 mg/dl (3.9 mmol/l) or below, OR symptomatic AND [2] cause known  Answer Assessment - Initial Assessment Questions 1. SYMPTOMS: "What symptoms are you concerned about?"     Cold, she states especially at nighttime and her hands will be cold.  2. ONSET:  "When did the symptoms start?"     This afternoon just before calling.  3. BLOOD GLUCOSE: "What is your blood glucose level?"      65.  4. USUAL RANGE: "What is  your blood glucose level usually?" (e.g., usual fasting morning value, usual evening value)     108.  5. TYPE 1 or 2:  "Do you know what type of diabetes you have?"  (e.g., Type 1, Type 2, Gestational; doesn't know)      Patient denies being diabetic; chart states Type 2 DM.  6. INSULIN: "Do you take insulin?" "What type of insulin(s) do you use? What is the mode of delivery? (syringe, pen; injection or pump) "When did you last give yourself an insulin dose?" (i.e., time or hours/minutes ago) "How much did you give?" (i.e., how many units)     Denies.  7. DIABETES PILLS: "Do you take any pills for your diabetes?" If Yes, ask: "What is the name of the medicine(s) that you take for high blood sugar?"     Januvia.  8. OTHER SYMPTOMS: "Do you have any symptoms?" (e.g., fever, frequent urination, difficulty breathing, vomiting)     She states she has felt palpitations or SOB intermittent for 2 days.  9. LOW BLOOD GLUCOSE TREATMENT: "What have you done so far to treat the low blood glucose level?"     Not yet, patient is getting ready to eat and drink now.  10. FOOD: "When did you last eat or drink?"       Patient states she is getting ready to eat now. Patient states she has been drinking water, coffee, and fruit juice. She states she has not eaten anything today. She states she  just has a poor appetite.  11. ALONE: "Are you alone right now or is someone with you?"        Husband is with her now.  12. PREGNANCY: "Is there any chance you are pregnant?" "When was your last menstrual period?"       N/A.  Protocols used: Diabetes - Low Blood Sugar-A-AH

## 2023-11-09 NOTE — Telephone Encounter (Signed)
 Glucose stable, keep follow up with PCP

## 2023-11-16 ENCOUNTER — Telehealth: Payer: Self-pay | Admitting: Nurse Practitioner

## 2023-11-16 NOTE — Telephone Encounter (Signed)
 Copied from CRM [id number]. Topic: Clinical - Medication Question >> Nov 16, 2023  4:24 PM Tammy Hudson wrote: Reason for EAV:[name] is calling because she stated its time for a bone density exam and would like  a call back.[name]

## 2023-11-16 NOTE — Telephone Encounter (Signed)
Will you please schedule patient?

## 2023-11-17 NOTE — Telephone Encounter (Signed)
Called patient and scheduled appt for 4/29.

## 2023-11-20 ENCOUNTER — Other Ambulatory Visit: Payer: Self-pay | Admitting: Nurse Practitioner

## 2023-11-20 ENCOUNTER — Telehealth: Payer: Self-pay | Admitting: Family Medicine

## 2023-11-20 DIAGNOSIS — R051 Acute cough: Secondary | ICD-10-CM

## 2023-11-20 NOTE — Telephone Encounter (Signed)
 Copied from CRM [id number]. Topic: Clinical - Prescription Issue >> Nov 20, 2023 11:54 AM Everette C wrote: Reason for CRM: The patient has been directed to contact their PCP and request prior authorization for their prescription of benzonatate  (TESSALON  PERLES) 100 MG capsule [[id number]]  Please contact the patient further when possible

## 2023-11-20 NOTE — Telephone Encounter (Signed)
 Please review

## 2023-11-21 ENCOUNTER — Other Ambulatory Visit (HOSPITAL_COMMUNITY): Payer: Self-pay

## 2023-11-21 ENCOUNTER — Telehealth: Payer: Self-pay

## 2023-11-21 ENCOUNTER — Other Ambulatory Visit: Payer: Self-pay | Admitting: Nurse Practitioner

## 2023-11-21 ENCOUNTER — Ambulatory Visit (INDEPENDENT_AMBULATORY_CARE_PROVIDER_SITE_OTHER)

## 2023-11-21 ENCOUNTER — Other Ambulatory Visit: Payer: Self-pay

## 2023-11-21 DIAGNOSIS — Z780 Asymptomatic menopausal state: Secondary | ICD-10-CM | POA: Diagnosis not present

## 2023-11-21 DIAGNOSIS — R051 Acute cough: Secondary | ICD-10-CM

## 2023-11-21 MED ORDER — BENZONATATE 100 MG PO CAPS
100.0000 mg | ORAL_CAPSULE | Freq: Three times a day (TID) | ORAL | 0 refills | Status: DC | PRN
Start: 2023-11-21 — End: 2024-03-07

## 2023-11-21 NOTE — Telephone Encounter (Signed)
 Pharmacy Patient Advocate Encounter   Received notification from Pt Calls Messages that prior authorization for Benzonatate  100MG  capsules is required/requested.   Insurance verification completed.   The patient is insured through [facility] .   Per test claim: PA required; PA submitted to above mentioned insurance via CoverMyMeds Key/confirmation #/EOC [facility] Status is pending

## 2023-11-21 NOTE — Telephone Encounter (Signed)
 PA request has been Submitted. New Encounter has been or will be created for follow up. For additional info see Pharmacy Prior Auth telephone encounter from 11/21/23.

## 2023-11-22 ENCOUNTER — Ambulatory Visit

## 2023-11-22 NOTE — Telephone Encounter (Signed)
 Pharmacy Patient Advocate Encounter  Received notification from [facility] that Prior Authorization for Benzonatate  100MG  capsules has been DENIED.  Full denial letter will be uploaded to the media tab. See denial reason below.   PA #/Case ID/Reference #: [id number]     Denied. Medicare law excludes certain drugs from being covered by Part D. This drug is one of those excluded drugs because it is a cough and cold product and so it cannot be paid by your Medicare Part D.

## 2023-12-12 ENCOUNTER — Other Ambulatory Visit (HOSPITAL_COMMUNITY): Payer: Self-pay | Admitting: Anesthesiology

## 2023-12-12 ENCOUNTER — Telehealth: Payer: Self-pay | Admitting: Nurse Practitioner

## 2023-12-12 DIAGNOSIS — M542 Cervicalgia: Secondary | ICD-10-CM

## 2023-12-12 DIAGNOSIS — M5459 Other low back pain: Secondary | ICD-10-CM

## 2023-12-12 DIAGNOSIS — M5416 Radiculopathy, lumbar region: Secondary | ICD-10-CM

## 2023-12-12 NOTE — Telephone Encounter (Signed)
 Copied from CRM [id number]. Topic: Clinical - Medication Refill >> Dec 12, 2023 10:15 AM Stanly Early wrote: Medication: cyclobenzaprine  (FLEXERIL ) 5 MG tablet  Has the patient contacted their pharmacy? Yes (Agent: If no, request that the patient contact the pharmacy for the refill. If patient does not wish to contact the pharmacy document the reason why and proceed with request.) (Agent: If yes, when and what did the pharmacy advise?)  This is the patient's preferred pharmacy:  [id number] - [place] [place] - [address] [place] [address] Phone: [phone] Fax: [phone]   Is this the correct pharmacy for this prescription? Yes If no, delete pharmacy and type the correct one.   Has the prescription been filled recently? No  Is the patient out of the medication? No about 2 left  Has the patient been seen for an appointment in the last year OR does the patient have an upcoming appointment? Yes  Can we respond through MyChart? Yes  Agent: Please be advised that Rx refills may take up to 3 business days. We ask that you follow-up with your pharmacy.

## 2023-12-13 MED ORDER — CYCLOBENZAPRINE HCL 5 MG PO TABS
5.0000 mg | ORAL_TABLET | Freq: Three times a day (TID) | ORAL | 1 refills | Status: DC | PRN
Start: 2023-12-13 — End: 2024-01-02

## 2023-12-13 NOTE — Telephone Encounter (Signed)
Sent Flexeril for the patient

## 2023-12-13 NOTE — Telephone Encounter (Signed)
 Tammy Hudson patient.   Last OV 10/16/23. Last RF 10/12/2023 #30 1 RF. Next OV 04/18/24

## 2023-12-13 NOTE — Telephone Encounter (Signed)
 Patient requesting refills on Flexeril  Rx

## 2023-12-13 NOTE — Addendum Note (Signed)
 Addended by: Jolyne Needs on: 12/13/2023 04:22 PM   Modules accepted: Orders

## 2023-12-13 NOTE — Telephone Encounter (Signed)
 Called pt , she has been made aware of meds sent

## 2023-12-19 ENCOUNTER — Ambulatory Visit (HOSPITAL_COMMUNITY)
Admission: RE | Admit: 2023-12-19 | Discharge: 2023-12-19 | Disposition: A | Discharge status: Home / Self Care | Source: Ambulatory Visit | Attending: Anesthesiology | Admitting: Anesthesiology

## 2023-12-19 DIAGNOSIS — M5459 Other low back pain: Secondary | ICD-10-CM | POA: Insufficient documentation

## 2023-12-19 DIAGNOSIS — M5416 Radiculopathy, lumbar region: Secondary | ICD-10-CM | POA: Diagnosis present

## 2023-12-29 ENCOUNTER — Other Ambulatory Visit: Payer: Self-pay | Admitting: Nurse Practitioner

## 2023-12-29 DIAGNOSIS — F5101 Primary insomnia: Secondary | ICD-10-CM

## 2024-01-02 ENCOUNTER — Other Ambulatory Visit: Payer: Self-pay | Admitting: Nurse Practitioner

## 2024-01-02 DIAGNOSIS — M542 Cervicalgia: Secondary | ICD-10-CM

## 2024-01-02 MED ORDER — CYCLOBENZAPRINE HCL 5 MG PO TABS: 5.0000 mg | ORAL_TABLET | Freq: Three times a day (TID) | ORAL | 0 refills | Status: DC | PRN

## 2024-01-02 NOTE — Telephone Encounter (Unsigned)
 Copied from CRM [id number]. Topic: Clinical - Medication Refill >> Jan 02, 2024  2:42 PM Tammy Hudson wrote: Medication:  cyclobenzaprine  (FLEXERIL ) 5 MG tablet   Has the patient contacted their pharmacy? Yes Pharmacy stated to call provider  This is the patient's preferred pharmacy:  [id number] [place], [place] - [address] [place] [address] Phone: [phone] Fax: [phone]    Is this the correct pharmacy for this prescription? Yes If no, delete pharmacy and type the correct one.   Has the prescription been filled recently? Yes  Is the patient out of the medication? Yes  Has the patient been seen for an appointment in the last year OR does the patient have an upcoming appointment? Yes  Can we respond through MyChart? No  Agent: Please be advised that Rx refills may take up to 3 business days. We ask that you follow-up with your pharmacy.

## 2024-01-08 ENCOUNTER — Encounter (HOSPITAL_COMMUNITY): Payer: Self-pay | Admitting: Anesthesiology

## 2024-01-12 ENCOUNTER — Telehealth: Payer: Self-pay | Admitting: Nurse Practitioner

## 2024-01-12 NOTE — Telephone Encounter (Signed)
 Called and spoke with patient. She states that specialist would not fill until she was seen with them. She is currently having some testing done with their office. I advised for her to contact their office back and see if they will give her a months worth until she can get in to see them. Patient advised that she would contact their office.

## 2024-01-12 NOTE — Telephone Encounter (Signed)
 Patient gets this med from rheumatology- needs to contact their office

## 2024-01-12 NOTE — Telephone Encounter (Signed)
 Need new order for: Medication: methotrexate (RHEUMATREX) 2.5 MG tablet

## 2024-01-12 NOTE — Telephone Encounter (Signed)
 Copied from CRM [id number]. Topic: Clinical - Medication Refill >> Jan 12, 2024 10:20 AM Baldomero Bone wrote: Medication: methotrexate (RHEUMATREX) 2.5 MG tablet  Has the patient contacted their pharmacy? No (Agent: If no, request that the patient contact the pharmacy for the refill. If patient does not wish to contact the pharmacy document the reason why and proceed with request.) (Agent: If yes, when and what did the pharmacy advise?)  This is the patient's preferred pharmacy:  [id number] [place], [place] - [address] [place] [address] Phone: [phone] Fax: [phone]  Is this the correct pharmacy for this prescription? Yes If no, delete pharmacy and type the correct one.   Has the prescription been filled recently? No  Is the patient out of the medication? Yes  Has the patient been seen for an appointment in the last year OR does the patient have an upcoming appointment? Yes  Can we respond through MyChart? Yes  Agent: Please be advised that Rx refills may take up to 3 business days. We ask that you follow-up with your pharmacy.

## 2024-01-13 ENCOUNTER — Other Ambulatory Visit: Payer: Self-pay | Admitting: Nurse Practitioner

## 2024-01-13 DIAGNOSIS — E1169 Type 2 diabetes mellitus with other specified complication: Secondary | ICD-10-CM

## 2024-01-22 ENCOUNTER — Encounter (INDEPENDENT_AMBULATORY_CARE_PROVIDER_SITE_OTHER): Payer: Self-pay | Admitting: Otolaryngology

## 2024-01-22 ENCOUNTER — Ambulatory Visit (INDEPENDENT_AMBULATORY_CARE_PROVIDER_SITE_OTHER): Admitting: Otolaryngology

## 2024-01-22 VITALS — BP 149/74 | HR 96

## 2024-01-22 DIAGNOSIS — D37030 Neoplasm of uncertain behavior of the parotid salivary glands: Secondary | ICD-10-CM

## 2024-01-22 DIAGNOSIS — K118 Other diseases of salivary glands: Secondary | ICD-10-CM | POA: Diagnosis not present

## 2024-01-22 NOTE — Progress Notes (Signed)
 ENT CONSULT:  Reason for Consult: L > R parotid prominence concern for a mass    HPI: Discussed the use of AI scribe software for clinical note transcription with the patient, who gave verbal consent to proceed.  History of Present Illness Tammy Hudson is a 73 year old female who presents with bilateral parotid gland swelling fullness, worse on the left side.   She has experienced swelling in her parotid glands since the beginning of the year, with the left side being more prominent. The swelling has increased in size over time.  She experiences intermittent pain associated with the swelling, describing it as 'off and on hurt.' No facial muscle weakness, numbness, or tingling around her face.  There is no history of skin cancer. She has not undergone any imaging studies for this issue prior to this visit.     Records Reviewed:  Urgent care visit for suspected sciatica 01/12/24 Back Pain This is a new problem. The current episode started 1 to 4 weeks ago. The problem occurs intermittently. The pain is present in the gluteal. The quality of the pain is described as burning. The pain radiates to the left thigh. The symptoms are aggravated by standing. Pertinent negatives include no bladder incontinence, bowel incontinence, pelvic pain or perianal numbness.     Past Medical History:  Diagnosis Date   Allergy    Anxiety    Arthritis    Cough variant asthma 11/05/2021   Depression    Diabetes mellitus without complication (HCC)    GERD (gastroesophageal reflux disease)    Hyperlipidemia    Hypertension    Neuromuscular disorder (HCC)     Past Surgical History:  Procedure Laterality Date   ABDOMINAL HYSTERECTOMY     COLONOSCOPY WITH PROPOFOL  N/A 01/12/2022   Procedure: COLONOSCOPY WITH PROPOFOL ;  Surgeon: Eartha Angelia Sieving, MD;  [facility];  Service: Gastroenterology;  Laterality: N/A;  220 pre op done already   FLEXIBLE SIGMOIDOSCOPY  01/11/2022    Procedure: FLEXIBLE SIGMOIDOSCOPY;  Surgeon: Eartha Angelia Sieving, MD;  Location: [facility];  Service: Gastroenterology;;   POLYPECTOMY  01/12/2022   Procedure: POLYPECTOMY INTESTINAL;  Surgeon: Eartha Angelia Sieving, MD;  Location: [facility];  Service: Gastroenterology;;    No family history on file.  Social History:  reports that she quit smoking about 28 years ago. Her smoking use included cigarettes. She has never used smokeless tobacco. She reports that she does not drink alcohol and does not use drugs.  Allergies: No Known Allergies  Medications: I have reviewed the patient's current medications.  The PMH, PSH, Medications, Allergies, and SH were reviewed and updated.  ROS: Constitutional: Negative for fever, weight loss and weight gain. Cardiovascular: Negative for chest pain and dyspnea on exertion. Respiratory: Is not experiencing shortness of breath at rest. Gastrointestinal: Negative for nausea and vomiting. Neurological: Negative for headaches. Psychiatric: The patient is not nervous/anxious  There were no vitals taken for this visit. There is no height or weight on file to calculate BMI.  PHYSICAL EXAM:  Exam: General: Well-developed, well-nourished Communication and Voice: Clear pitch and clarity Respiratory Respiratory effort: Equal inspiration and expiration without stridor Cardiovascular Peripheral Vascular: Warm extremities with equal color/perfusion Eyes: No nystagmus with equal extraocular motion bilaterally Neuro/Psych/Balance: Patient oriented to person, place, and time; Appropriate mood and affect; Gait is intact with no imbalance; Cranial nerves I-XII are intact Head and Face Inspection: Normocephalic and atraumatic without mass or lesion Palpation: Facial skeleton intact without bony  stepoffs Salivary Glands: No mass or tenderness left parotid feels more full than the right, slight diffuse enlargement on both side, soft and non-tender   Facial Strength: Facial motility symmetric and full bilaterally ENT Pinna: External ear intact and fully developed External canal: Canal is patent with intact skin Tympanic Membrane: Clear and mobile External Nose: No scar or anatomic deformity Internal Nose: Septum is relatively straight on anterior rhinoscopy  Lips, Teeth, and gums: Mucosa and teeth intact and viable TMJ: No pain to palpation with full mobility Oral cavity/oropharynx: No erythema or exudate, no lesions present Neck Neck and Trachea: Midline trachea without mass or lesion Thyroid : No mass or nodularity Lymphatics: No lymphadenopathy   Studies Reviewed: CT chest 01/13/22 IMPRESSION: 1. No evidence of interstitial lung disease. 2. Three-vessel coronary atherosclerosis. 3. Small pericardial effusion/thickening. 4. Diffuse hepatic steatosis. 5. Aortic Atherosclerosis (ICD10-I70.0) and Emphysema (ICD10-J43.9).  Assessment/Plan: No diagnosis found.  Assessment and Plan Assessment & Plan Parotid gland fullness vs mass worse on the left x 12 mo, gradually worse Bilateral parotid gland fullness with differential including sialoadenitis vs parotid neoplasm. No facial weakness or numbness, no skin cancer history. No palpable mass on exam - Order CT nec with contrast  - Schedule follow-up to discuss CT results.      Thank you for allowing me to participate in the care of this patient. Please do not hesitate to contact me with any questions or concerns.   Elena Larry, MD Otolaryngology [facility] Phone: [phone] Fax: [phone]    01/22/2024, 2:09 PM

## 2024-02-08 ENCOUNTER — Ambulatory Visit: Admitting: Nurse Practitioner

## 2024-02-08 ENCOUNTER — Other Ambulatory Visit: Payer: Self-pay | Admitting: Nurse Practitioner

## 2024-02-08 DIAGNOSIS — M542 Cervicalgia: Secondary | ICD-10-CM

## 2024-02-28 ENCOUNTER — Other Ambulatory Visit: Payer: Self-pay | Admitting: Nurse Practitioner

## 2024-02-28 DIAGNOSIS — M542 Cervicalgia: Secondary | ICD-10-CM

## 2024-03-06 NOTE — Progress Notes (Signed)
 The patient attended a screening event on 12/16/2023 where her BP screening results was 171/105, non-fasting glucose 170. At the event the patient noted she has [employer] and does not smoke. Patient declined having any SDOH insecurities at the event. Pt list pcp as Mary-Margaret Gladis, FNP. At the event pt was advised to contact pcp about BP by clinician. Per chart review pt has a pcp and the last office visit was 10/16/2023 for hypertension. The pt BP was 157/97 on 10/16/2023. According to chart pt is currently on amlodipine  and losartan  to manage BP.  Chart review also indicates pt has a future appt with pcp on 04/18/2024. Abnormal letter sent with BP and resources in case needed by pt. No additional Health equity team support indicated at this time.

## 2024-03-07 ENCOUNTER — Encounter: Payer: Self-pay | Admitting: Family

## 2024-03-07 ENCOUNTER — Ambulatory Visit (INDEPENDENT_AMBULATORY_CARE_PROVIDER_SITE_OTHER): Admitting: Family

## 2024-03-07 VITALS — BP 134/81 | HR 99 | Temp 97.6°F | Ht 67.0 in | Wt 219.0 lb

## 2024-03-07 DIAGNOSIS — E1142 Type 2 diabetes mellitus with diabetic polyneuropathy: Secondary | ICD-10-CM

## 2024-03-07 DIAGNOSIS — M5432 Sciatica, left side: Secondary | ICD-10-CM

## 2024-03-07 MED ORDER — METHYLPREDNISOLONE ACETATE 80 MG/ML IJ SUSP
80.0000 mg | Freq: Once | INTRAMUSCULAR | Status: AC
  Administered 2024-03-07: 80 mg via INTRAMUSCULAR

## 2024-03-07 MED ORDER — DICLOFENAC SODIUM 75 MG PO TBEC: 75.0000 mg | DELAYED_RELEASE_TABLET | Freq: Two times a day (BID) | ORAL | 0 refills | Status: DC

## 2024-03-07 MED ORDER — KETOROLAC TROMETHAMINE 60 MG/2ML IM SOLN
60.0000 mg | Freq: Once | INTRAMUSCULAR | Status: AC
  Administered 2024-03-07: 60 mg via INTRAMUSCULAR

## 2024-03-07 NOTE — Progress Notes (Signed)
 Subjective:    Patient ID: Tammy Hudson, female    DOB: 19-Jun-1951, 73 y.o.   MRN: [id number]  Chief Complaint  Patient presents with   Leg Pain    Back of her left leg pain started 2 weeks ago    Pt presents to the office today with left buttocks pain that radiates down to left thigh pain that started two weeks ago. States the pain is worse when she stands. Unable to cook or clean her home.   Denies any fever or UTI symptoms.   She is a diabetic, but last A1C was 6.2.   Pt has MRI on 12/19/23 that showed, 1. L5-S1: Retrolisthesis of 3 mm. 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 Facet degeneration and hypertrophy. Stenosis of the canal and lateral recesses left more than right. Neural compression is likely at this level, particularly on the left. The facet degenerative changes and degenerative endplate marrow edema could also be associated with regional pain. 2. L4-5: Disc bulge with a small central protrusion. Mild facet ligamentous hypertrophy. Mild multifactorial stenosis at this level that could be symptomatic. Leg Pain  The incident occurred more than 1 week ago. There was no injury mechanism. The pain is present in the left thigh (left buttocks). The quality of the pain is described as burning and shooting. The pain is at a severity of 8/10. The pain is moderate. The pain has been Intermittent since onset. Associated symptoms include numbness and tingling. She reports no foreign bodies present. The symptoms are aggravated by weight bearing. Treatments tried: flexeril . The treatment provided mild relief.      Review of Systems  Neurological:  Positive for tingling and numbness.  All other systems reviewed and are negative.   Social History   Socioeconomic History   Marital status: Married    Spouse name: Not on file   Number of children: Not on file   Years of education:  Not on file   Highest education level: Not on file  Occupational History   Not on file  Tobacco Use   Smoking status: Former    Current packs/day: 0.00    Types: Cigarettes    Quit date: 04/02/1995    Years since quitting: 28.9   Smokeless tobacco: Never  Substance and Sexual Activity   Alcohol use: No    Alcohol/week: 0.0 standard drinks of alcohol   Drug use: No   Sexual activity: Not on file  Other Topics Concern   Not on file  Social History Narrative   Retired from the [employer]    2 children    Husband lives in [place]    Social Drivers of Health   Financial Resource Strain: Low Risk  (07/03/2023)   Overall Financial Resource Strain (CARDIA)    Difficulty of Paying Living Expenses: Not hard at all  Food Insecurity: No Food Insecurity (01/12/2024)   Received from [facility]   Hunger Vital Sign    Within the past 12 months, you worried that your food would run out before you got the money to buy more.: Never true    Within the past 12 months, the food you bought just didn't last and you didn't have money to get more.: Never true  Transportation Needs: No Transportation Needs (01/12/2024)   [facility] - Transportation    Lack of Transportation (Medical): No    Lack  of Transportation (Non-Medical): No  Physical Activity: Insufficiently Active (07/03/2023)   Exercise Vital Sign    Days of Exercise per Week: 3 days    Minutes of Exercise per Session: 30 min  Stress: No Stress Concern Present (07/03/2023)   [employer] of Occupational Health - Occupational Stress Questionnaire    Feeling of Stress : Not at all  Social Connections: Socially Integrated (07/03/2023)   Social Connection and Isolation Panel    Frequency of Communication with Friends and Family: More than three times a week    Frequency of Social Gatherings with Friends and Family: Three times a week    Attends Religious Services: More than 4 times per year    Active Member of  Clubs or Organizations: Yes    Attends Banker Meetings: More than 4 times per year    Marital Status: Married   History reviewed. No pertinent family history.      Objective:   Physical Exam Vitals reviewed.  Constitutional:      General: She is not in acute distress.    Appearance: She is well-developed. She is obese.  Eyes:     Pupils: Pupils are equal, round, and reactive to light.  Neck:     Thyroid : No thyromegaly.  Cardiovascular:     Rate and Rhythm: Normal rate and regular rhythm.     Heart sounds: Normal heart sounds. No murmur heard. Pulmonary:     Effort: Pulmonary effort is normal. No respiratory distress.     Breath sounds: Normal breath sounds. No wheezing.  Abdominal:     General: Bowel sounds are normal. There is no distension.     Palpations: Abdomen is soft.     Tenderness: There is no abdominal tenderness.  Musculoskeletal:        General: No tenderness.     Cervical back: Normal range of motion and neck supple.     Comments: Pain in left leg with standing   Skin:    General: Skin is warm and dry.  Neurological:     Mental Status: She is alert and oriented to person, place, and time.     Cranial Nerves: No cranial nerve deficit.     Deep Tendon Reflexes: Reflexes are normal and symmetric.  Psychiatric:        Behavior: Behavior normal.        Thought Content: Thought content normal.        Judgment: Judgment normal.       BP 134/81   Pulse 99   Temp 97.6 F (36.4 C) (Temporal)   Ht 5' 7 (1.702 m)   Wt 219 lb (99.3 kg)   BMI 34.30 kg/m      Assessment & Plan:  Tammy Hudson comes in today with chief complaint of Leg Pain (Back of her left leg pain started 2 weeks ago )   Diagnosis and orders addressed:  1. Sciatica of left side (Primary) Rest ROM exercises  Continue Flexeril  5 mg  Can take diclofenac  BID with food, no other NSAIDs - methylPREDNISolone  acetate (DEPO-MEDROL ) injection 80 mg - ketorolac  (TORADOL )  injection 60 mg - diclofenac  (VOLTAREN ) 75 MG EC tablet; Take 1 tablet (75 mg total) by mouth 2 (two) times daily.  Dispense: 60 tablet; Refill: 0  2. Type 2 diabetes mellitus with peripheral neuropathy (HCC) Strict low carb diet  Keep follow up with PCP  Bari Learn, FNP

## 2024-03-07 NOTE — Patient Instructions (Signed)

## 2024-03-26 ENCOUNTER — Ambulatory Visit (HOSPITAL_COMMUNITY)
Admission: RE | Admit: 2024-03-26 | Discharge: 2024-03-26 | Disposition: A | Discharge status: Home / Self Care | Source: Ambulatory Visit | Attending: Otolaryngology | Admitting: Otolaryngology

## 2024-03-26 DIAGNOSIS — K118 Other diseases of salivary glands: Secondary | ICD-10-CM | POA: Diagnosis not present

## 2024-03-26 DIAGNOSIS — D37030 Neoplasm of uncertain behavior of the parotid salivary glands: Secondary | ICD-10-CM | POA: Diagnosis present

## 2024-03-26 LAB — POCT I-STAT CREATININE: Creatinine, Ser: 1.5 mg/dL — ABNORMAL HIGH (ref 0.44–1.00)

## 2024-03-26 MED ORDER — IOHEXOL 300 MG/ML  SOLN
75.0000 mL | Freq: Once | INTRAMUSCULAR | Status: AC | PRN
  Administered 2024-03-26: 60 mL via INTRAVENOUS

## 2024-03-27 ENCOUNTER — Other Ambulatory Visit: Payer: Self-pay | Admitting: Nurse Practitioner

## 2024-03-27 DIAGNOSIS — M542 Cervicalgia: Secondary | ICD-10-CM

## 2024-03-29 ENCOUNTER — Ambulatory Visit (INDEPENDENT_AMBULATORY_CARE_PROVIDER_SITE_OTHER): Payer: Self-pay

## 2024-03-29 NOTE — Telephone Encounter (Signed)
 Called patient to let her know about her results. Patient understood, but wanted to know if she could be given any medication for the inflammation in her ear drums? Please advise.

## 2024-04-01 ENCOUNTER — Telehealth (INDEPENDENT_AMBULATORY_CARE_PROVIDER_SITE_OTHER): Payer: Self-pay

## 2024-04-01 NOTE — Telephone Encounter (Signed)
 Called patient back with an answer to her question. Patient understood.

## 2024-04-08 ENCOUNTER — Other Ambulatory Visit: Payer: Self-pay | Admitting: Nurse Practitioner

## 2024-04-08 DIAGNOSIS — K219 Gastro-esophageal reflux disease without esophagitis: Secondary | ICD-10-CM

## 2024-04-08 DIAGNOSIS — F411 Generalized anxiety disorder: Secondary | ICD-10-CM

## 2024-04-18 ENCOUNTER — Ambulatory Visit (INDEPENDENT_AMBULATORY_CARE_PROVIDER_SITE_OTHER): Admitting: Nurse Practitioner

## 2024-04-18 ENCOUNTER — Encounter: Payer: Self-pay | Admitting: Nurse Practitioner

## 2024-04-18 VITALS — BP 131/78 | HR 76 | Temp 97.4°F | Ht 67.0 in | Wt 218.0 lb

## 2024-04-18 DIAGNOSIS — E785 Hyperlipidemia, unspecified: Secondary | ICD-10-CM

## 2024-04-18 DIAGNOSIS — I10 Essential (primary) hypertension: Secondary | ICD-10-CM

## 2024-04-18 DIAGNOSIS — E119 Type 2 diabetes mellitus without complications: Secondary | ICD-10-CM | POA: Diagnosis not present

## 2024-04-18 DIAGNOSIS — E1169 Type 2 diabetes mellitus with other specified complication: Secondary | ICD-10-CM | POA: Diagnosis not present

## 2024-04-18 DIAGNOSIS — K219 Gastro-esophageal reflux disease without esophagitis: Secondary | ICD-10-CM

## 2024-04-18 DIAGNOSIS — E559 Vitamin D deficiency, unspecified: Secondary | ICD-10-CM

## 2024-04-18 DIAGNOSIS — E1142 Type 2 diabetes mellitus with diabetic polyneuropathy: Secondary | ICD-10-CM

## 2024-04-18 DIAGNOSIS — F5101 Primary insomnia: Secondary | ICD-10-CM

## 2024-04-18 DIAGNOSIS — F411 Generalized anxiety disorder: Secondary | ICD-10-CM

## 2024-04-18 DIAGNOSIS — Z7984 Long term (current) use of oral hypoglycemic drugs: Secondary | ICD-10-CM

## 2024-04-18 LAB — BAYER DCA HB A1C WAIVED: HB A1C (BAYER DCA - WAIVED): 6.2 % — ABNORMAL HIGH (ref 4.8–5.6)

## 2024-04-18 MED ORDER — ESCITALOPRAM OXALATE 20 MG PO TABS: 20.0000 mg | ORAL_TABLET | Freq: Every day | ORAL | 1 refills | Status: AC | PRN

## 2024-04-18 MED ORDER — AMLODIPINE BESYLATE 5 MG PO TABS: 5.0000 mg | ORAL_TABLET | Freq: Every day | ORAL | 1 refills | Status: AC

## 2024-04-18 MED ORDER — ALPRAZOLAM 0.25 MG PO TABS: 0.2500 mg | ORAL_TABLET | Freq: Two times a day (BID) | ORAL | 5 refills | Status: AC | PRN

## 2024-04-18 MED ORDER — SIMVASTATIN 20 MG PO TABS: 20.0000 mg | ORAL_TABLET | Freq: Every day | ORAL | 1 refills | Status: AC

## 2024-04-18 MED ORDER — ATORVASTATIN CALCIUM 40 MG PO TABS: 40.0000 mg | ORAL_TABLET | Freq: Every day | ORAL | 1 refills | Status: AC

## 2024-04-18 MED ORDER — LOSARTAN POTASSIUM 100 MG PO TABS: 100.0000 mg | ORAL_TABLET | Freq: Every day | ORAL | 1 refills | Status: AC

## 2024-04-18 MED ORDER — ZOLPIDEM TARTRATE 10 MG PO TABS: 10.0000 mg | ORAL_TABLET | Freq: Every evening | ORAL | 5 refills | Status: AC | PRN

## 2024-04-18 MED ORDER — PANTOPRAZOLE SODIUM 40 MG PO TBEC
40.0000 mg | DELAYED_RELEASE_TABLET | Freq: Every day | ORAL | 1 refills | Status: AC
Start: 2024-04-18 — End: ?

## 2024-04-18 MED ORDER — SITAGLIPTIN PHOSPHATE 100 MG PO TABS: 100.0000 mg | ORAL_TABLET | Freq: Every day | ORAL | 1 refills | Status: AC

## 2024-04-18 NOTE — Progress Notes (Signed)
 Subjective:    Patient ID: Tammy Hudson, female    DOB: May 05, 1951, 73 y.o.   MRN: [id number]   Chief Complaint: medical management of chronic issues     HPI:  Tammy Hudson is a 73 y.o. who identifies as a female who was assigned female at birth.   Social history: Lives with: by herself Work history: retired   Water engineer in today for follow up of the following chronic medical issues:  1. Essential hypertension No c/o chest pain, sob or headache. Patient stopped her losartan  and has been doubling up on Norvasc  and she says that works better for her. Blood pressure reading at home have been running since changing her meds. BP Readings from Last 3 Encounters:  03/07/24 134/81  01/22/24 (!) 149/74  12/16/23 (!) 171/105      2. Hyperlipidemia associated with type 2 diabetes mellitus (HCC) Does try  to watch diet. No dedicated exercise. Lab Results  Component Value Date   CHOL 198 10/16/2023   HDL 52 10/16/2023   LDLCALC 119 (H) 10/16/2023   TRIG 155 (H) 10/16/2023   CHOLHDL 3.8 10/16/2023   The 10-year ASCVD risk score (Arnett DK, et al., 2019) is: 30.5%    3. Type 2 diabetes mellitus with peripheral neuropathy (HCC) Denies any sores on her feet. Has numbness in toes  4. Diabetes mellitus treated with oral medication (HCC) Does not check blood sugar at home on a regular basis. She says she does try to watch her diet. Lab Results  Component Value Date   HGBA1C 6.9 (H) 10/17/2022    5. Vitamin D  deficiency Daily vitamin d  supplement   New complaints: Has sciatica- been seeing specialist and getting injections  No Known Allergies Outpatient Encounter Medications as of 04/18/2024  Medication Sig   albuterol  (PROVENTIL ) (2.5 MG/3ML) 0.083% nebulizer solution Inhale 3 mLs into the lungs every 4 (four) hours as needed for shortness of breath.   ALPRAZolam  (XANAX ) 0.25 MG tablet Take 1 tablet (0.25 mg total) by mouth 2 (two) times daily as needed.   amLODipine  (NORVASC )  5 MG tablet Take 1 tablet (5 mg total) by mouth daily.   atorvastatin  (LIPITOR) 40 MG tablet TAKE 1 TABLET BY MOUTH EVERY DAY   Cinnamon 500 MG capsule Take 500 mg by mouth daily.   cyclobenzaprine  (FLEXERIL ) 5 MG tablet TAKE 1 TABLET BY MOUTH THREE TIMES A DAY AS NEEDED FOR MUSCLE SPASM   diclofenac  (VOLTAREN ) 75 MG EC tablet Take 1 tablet (75 mg total) by mouth 2 (two) times daily.   diclofenac  Sodium (VOLTAREN ) 1 % GEL Apply 4 g topically 4 (four) times daily.   escitalopram  (LEXAPRO ) 20 MG tablet TAKE 1 TABLET (20 MG TOTAL) BY MOUTH DAILY AS NEEDED (DEPRESSION).   fluticasone-salmeterol (ADVAIR  HFA) 115-21 MCG/ACT inhaler Inhale 2 puffs into the lungs 2 (two) times daily.   folic acid (FOLVITE) 800 MCG tablet Take 800 mcg by mouth daily.   losartan  (COZAAR ) 100 MG tablet Take 1 tablet (100 mg total) by mouth daily.   pantoprazole  (PROTONIX ) 40 MG tablet TAKE 1 TABLET BY MOUTH EVERY DAY   simvastatin  (ZOCOR ) 20 MG tablet Take 1 tablet (20 mg total) by mouth daily at 6 PM.   sitaGLIPtin  (JANUVIA ) 100 MG tablet Take 1 tablet (100 mg total) by mouth daily.   zolpidem  (AMBIEN ) 10 MG tablet Take 1 tablet (10 mg total) by mouth at bedtime as needed for sleep.   No facility-administered encounter medications on file as of 04/18/2024.  Past Surgical History:  Procedure Laterality Date   ABDOMINAL HYSTERECTOMY     COLONOSCOPY WITH PROPOFOL  N/A 01/12/2022   Procedure: COLONOSCOPY WITH PROPOFOL ;  Surgeon: Eartha Angelia Sieving, MD;  Location: [facility];  Service: Gastroenterology;  Laterality: N/A;  220 pre op done already   FLEXIBLE SIGMOIDOSCOPY  01/11/2022   Procedure: FLEXIBLE SIGMOIDOSCOPY;  Surgeon: Eartha Angelia Sieving, MD;  Location: [facility];  Service: Gastroenterology;;   POLYPECTOMY  01/12/2022   Procedure: POLYPECTOMY INTESTINAL;  Surgeon: Eartha Angelia Sieving, MD;  Location: [facility];  Service: Gastroenterology;;    No family history on  file.    Controlled substance contract: n/a     Review of Systems  Constitutional:  Negative for diaphoresis.  Eyes:  Negative for pain.  Respiratory:  Negative for shortness of breath.   Cardiovascular:  Negative for chest pain, palpitations and leg swelling.  Gastrointestinal:  Negative for abdominal pain.  Endocrine: Negative for polydipsia.  Skin:  Negative for rash.  Neurological:  Negative for dizziness, weakness and headaches.  Hematological:  Does not bruise/bleed easily.  All other systems reviewed and are negative.      Objective:   Physical Exam Vitals and nursing note reviewed.  Constitutional:      General: She is not in acute distress.    Appearance: Normal appearance. She is well-developed.  HENT:     Head: Normocephalic.     Right Ear: Tympanic membrane normal.     Left Ear: Tympanic membrane normal.     Nose: Nose normal.     Mouth/Throat:     Mouth: Mucous membranes are moist.  Eyes:     Pupils: Pupils are equal, round, and reactive to light.  Neck:     Vascular: No carotid bruit or JVD.  Cardiovascular:     Rate and Rhythm: Normal rate and regular rhythm.     Heart sounds: Normal heart sounds.  Pulmonary:     Effort: Pulmonary effort is normal. No respiratory distress.     Breath sounds: Normal breath sounds. No wheezing or rales.  Chest:     Chest wall: No tenderness.  Abdominal:     General: Bowel sounds are normal. There is no distension or abdominal bruit.     Palpations: Abdomen is soft. There is no hepatomegaly, splenomegaly, mass or pulsatile mass.     Tenderness: There is no abdominal tenderness.  Musculoskeletal:        General: Normal range of motion.     Cervical back: Normal range of motion and neck supple.  Lymphadenopathy:     Cervical: No cervical adenopathy.  Skin:    General: Skin is warm and dry.  Neurological:     Mental Status: She is alert and oriented to person, place, and time.     Deep Tendon Reflexes: Reflexes  are normal and symmetric.  Psychiatric:        Behavior: Behavior normal.        Thought Content: Thought content normal.        Judgment: Judgment normal.     BP 131/78   Pulse 76   Temp (!) 97.4 F (36.3 C) (Temporal)   Ht 5' 7 (1.702 m)   Wt 218 lb (98.9 kg)   SpO2 96%   BMI 34.14 kg/m     Hgba1c discussed at appointment 6.2%     Assessment & Plan:   Tammy Hudson comes in today with chief complaint of medical management of chronic issues  Diagnosis and orders addressed:  1. Essential hypertension Low sodium diet - CBC with Differential/Platelet - CMP14+EGFR - amLODipine  (NORVASC ) 5 MG tablet; Take 1 tablet (5 mg total) by mouth daily.  Dispense: 90 tablet; Refill: 1 - losartan  (COZAAR ) 100 MG tablet; Take 1 tablet (100 mg total) by mouth daily.  Dispense: 90 tablet; Refill: 1  2. Hyperlipidemia associated with type 2 diabetes mellitus (HCC) Low fat diet - Lipid panel - simvastatin  (ZOCOR ) 20 MG tablet; Take 1 tablet (20 mg total) by mouth at bedtime.  Dispense: 90 tablet; Refill: 1  3. Type 2 diabetes mellitus with peripheral neuropathy (HCC) Check feet daily  4. Diabetes mellitus treated with oral medication (HCC) Continue to watch carbs in diet - Bayer DCA Hb A1c Waived - sitaGLIPtin  (JANUVIA ) 100 MG tablet; Take 1 tablet (100 mg total) by mouth daily.  Dispense: 90 tablet; Refill: 1   5. Vitamin D  deficiency Continue vitamin d  supplement  6. GAD (generalized anxiety disorder) Stress management - escitalopram  (LEXAPRO ) 20 MG tablet; Take 1 tablet (20 mg total) by mouth daily as needed (depression).  Dispense: 90 tablet; Refill: 1 - ALPRAZolam  (XANAX ) 0.25 MG tablet; Take 1 tablet (0.25 mg total) by mouth 2 (two) times daily as needed.  Dispense: 60 tablet; Refill: 5  7. Primary insomnia Bedtime routine - zolpidem  (AMBIEN ) 10 MG tablet; Take 1 tablet (10 mg total) by mouth at bedtime as needed for sleep.  Dispense: 30 tablet; Refill: 5  8.  GERD Avoid spicy foods Do not eat 2 hours prior to bedtime Start on protonix  40mg  daily  Labs pending Health Maintenance reviewed Diet and exercise encouraged  Follow up plan: 6 months   Mary-Margaret Gladis, FNP

## 2024-04-18 NOTE — Patient Instructions (Signed)

## 2024-04-19 ENCOUNTER — Ambulatory Visit: Payer: Self-pay | Admitting: Nurse Practitioner

## 2024-04-19 ENCOUNTER — Other Ambulatory Visit: Payer: Self-pay | Admitting: Nurse Practitioner

## 2024-04-19 DIAGNOSIS — E1142 Type 2 diabetes mellitus with diabetic polyneuropathy: Secondary | ICD-10-CM

## 2024-04-19 LAB — CBC WITH DIFFERENTIAL/PLATELET
Basophils Absolute: 0 x10E3/uL (ref 0.0–0.2)
Basos: 1 %
EOS (ABSOLUTE): 0.3 x10E3/uL (ref 0.0–0.4)
Eos: 4 %
Hematocrit: 41.8 % (ref 34.0–46.6)
Hemoglobin: 13 g/dL (ref 11.1–15.9)
Immature Grans (Abs): 0.1 x10E3/uL (ref 0.0–0.1)
Immature Granulocytes: 1 %
Lymphocytes Absolute: 1.4 x10E3/uL (ref 0.7–3.1)
Lymphs: 21 %
MCH: 28.4 pg (ref 26.6–33.0)
MCHC: 31.1 g/dL — ABNORMAL LOW (ref 31.5–35.7)
MCV: 91 fL (ref 79–97)
Monocytes Absolute: 0.8 x10E3/uL (ref 0.1–0.9)
Monocytes: 13 %
Neutrophils Absolute: 3.9 x10E3/uL (ref 1.4–7.0)
Neutrophils: 60 %
Platelets: 232 x10E3/uL (ref 150–450)
RBC: 4.58 x10E6/uL (ref 3.77–5.28)
RDW: 13 % (ref 11.7–15.4)
WBC: 6.5 x10E3/uL (ref 3.4–10.8)

## 2024-04-19 LAB — LIPID PANEL
Chol/HDL Ratio: 5.3 ratio — ABNORMAL HIGH (ref 0.0–4.4)
Cholesterol, Total: 249 mg/dL — ABNORMAL HIGH (ref 100–199)
HDL: 47 mg/dL (ref >39)
LDL Chol Calc (NIH): 129 mg/dL — ABNORMAL HIGH (ref 0–99)
Triglycerides: 406 mg/dL — ABNORMAL HIGH (ref 0–149)
VLDL Cholesterol Cal: 73 mg/dL — ABNORMAL HIGH (ref 5–40)

## 2024-04-19 LAB — CMP14+EGFR
ALT: 11 IU/L (ref 0–32)
AST: 25 IU/L (ref 0–40)
Albumin: 4.2 g/dL (ref 3.8–4.8)
Alkaline Phosphatase: 100 IU/L (ref 49–135)
BUN/Creatinine Ratio: 15 (ref 12–28)
BUN: 18 mg/dL (ref 8–27)
Bilirubin Total: 0.3 mg/dL (ref 0.0–1.2)
CO2: 21 mmol/L (ref 20–29)
Calcium: 9.5 mg/dL (ref 8.7–10.3)
Chloride: 102 mmol/L (ref 96–106)
Creatinine, Ser: 1.24 mg/dL — ABNORMAL HIGH (ref 0.57–1.00)
Globulin, Total: 2.4 g/dL (ref 1.5–4.5)
Glucose: 95 mg/dL (ref 70–99)
Potassium: 3.9 mmol/L (ref 3.5–5.2)
Sodium: 140 mmol/L (ref 134–144)
Total Protein: 6.6 g/dL (ref 6.0–8.5)
eGFR: 46 mL/min/1.73 — ABNORMAL LOW (ref >59)

## 2024-04-25 ENCOUNTER — Ambulatory Visit (INDEPENDENT_AMBULATORY_CARE_PROVIDER_SITE_OTHER): Admitting: Otolaryngology

## 2024-04-25 VITALS — BP 140/79 | HR 85 | Temp 97.7°F | Ht 67.0 in | Wt 215.0 lb

## 2024-04-25 DIAGNOSIS — K1120 Sialoadenitis, unspecified: Secondary | ICD-10-CM | POA: Diagnosis not present

## 2024-04-25 NOTE — Progress Notes (Signed)
 ENT CONSULT:  Reason for Consult: L > R parotid prominence concern for a mass    HPI: Discussed the use of AI scribe software for clinical note transcription with the patient, who gave verbal consent to proceed.  History of Present Illness Tammy Hudson is a 73 year old female who presents with bilateral parotid gland swelling fullness, worse on the left side.   She has experienced swelling in her parotid glands since the beginning of the year, with the left side being more prominent. The swelling has increased in size over time.  She experiences intermittent pain associated with the swelling, describing it as 'off and on hurt.' No facial muscle weakness, numbness, or tingling around her face.  There is no history of skin cancer. She has not undergone any imaging studies for this issue prior to this visit.   Tammy Hudson is a 73 year old female who presents for evaluation of a mediastinal cyst and fullness in the parotid glands.  She experiences a sensation of fullness on the left side of her parotid glands. A recent scan did not show any new findings in the parotid glands.  A structure in the mediastinum was noted on a scan, which has remained unchanged for the past two years. This was initially identified during a CT chest scan in June 2023. The structure is described as a small cyst that has not changed in size over time.     Records Reviewed:  Urgent care visit for suspected sciatica 01/12/24 Back Pain This is a new problem. The current episode started 1 to 4 weeks ago. The problem occurs intermittently. The pain is present in the gluteal. The quality of the pain is described as burning. The pain radiates to the left thigh. The symptoms are aggravated by standing. Pertinent negatives include no bladder incontinence, bowel incontinence, pelvic pain or perianal numbness.     Past Medical History:  Diagnosis Date   Allergy    Anxiety    Arthritis    Cough variant  asthma 11/05/2021   Depression    Diabetes mellitus without complication (HCC)    GERD (gastroesophageal reflux disease)    Hyperlipidemia    Hypertension    Neuromuscular disorder (HCC)     Past Surgical History:  Procedure Laterality Date   ABDOMINAL HYSTERECTOMY     COLONOSCOPY WITH PROPOFOL  N/A 01/12/2022   Procedure: COLONOSCOPY WITH PROPOFOL ;  Surgeon: Eartha Angelia Sieving, MD;  Location: [facility];  Service: Gastroenterology;  Laterality: N/A;  220 pre op done already   FLEXIBLE SIGMOIDOSCOPY  01/11/2022   Procedure: FLEXIBLE SIGMOIDOSCOPY;  Surgeon: Eartha Angelia Sieving, MD;  Location: [facility];  Service: Gastroenterology;;   POLYPECTOMY  01/12/2022   Procedure: POLYPECTOMY INTESTINAL;  Surgeon: Eartha Angelia Sieving, MD;  Location: [facility];  Service: Gastroenterology;;    No family history on file.  Social History:  reports that she quit smoking about 29 years ago. Her smoking use included cigarettes. She has never used smokeless tobacco. She reports that she does not drink alcohol and does not use drugs.  Allergies: No Known Allergies  Medications: I have reviewed the patient's current medications.  The PMH, PSH, Medications, Allergies, and SH were reviewed and updated.  ROS: Constitutional: Negative for fever, weight loss and weight gain. Cardiovascular: Negative for chest pain and dyspnea on exertion. Respiratory: Is not experiencing shortness of breath at rest. Gastrointestinal: Negative for nausea and vomiting. Neurological: Negative for headaches. Psychiatric: The patient is not  nervous/anxious  Blood pressure (!) 140/79, pulse 85, temperature 97.7 F (36.5 C), temperature source Oral, height 5' 7 (1.702 m), weight 215 lb (97.5 kg), SpO2 93%. Body mass index is 33.67 kg/m.  PHYSICAL EXAM:  Exam: General: Well-developed, well-nourished Communication and Voice: Clear pitch and clarity Respiratory Respiratory effort: Equal  inspiration and expiration without stridor Cardiovascular Peripheral Vascular: Warm extremities with equal color/perfusion Eyes: No nystagmus with equal extraocular motion bilaterally Neuro/Psych/Balance: Patient oriented to person, place, and time; Appropriate mood and affect; Gait is intact with no imbalance; Cranial nerves I-XII are intact Head and Face Inspection: Normocephalic and atraumatic without mass or lesion Palpation: Facial skeleton intact without bony stepoffs Salivary Glands: No mass or tenderness left parotid feels slightly more full than the right, slight diffuse enlargement on both sides, soft and non-tender  Facial Strength: Facial motility symmetric and full bilaterally ENT Pinna: External ear intact and fully developed External canal: Canal is patent with intact skin Tympanic Membrane: Clear and mobile External Nose: No scar or anatomic deformity Internal Nose: Septum is relatively straight on anterior rhinoscopy  Lips, Teeth, and gums: Mucosa and teeth intact and viable TMJ: No pain to palpation with full mobility Oral cavity/oropharynx: No erythema or exudate, no lesions present Neck Neck and Trachea: Midline trachea without mass or lesion Thyroid : No mass or nodularity Lymphatics: No lymphadenopathy   Studies Reviewed: CT chest 01/13/22 IMPRESSION: 1. No evidence of interstitial lung disease. 2. Three-vessel coronary atherosclerosis. 3. Small pericardial effusion/thickening. 4. Diffuse hepatic steatosis. 5. Aortic Atherosclerosis (ICD10-I70.0) and Emphysema (ICD10-J43.9).  CT neck 03/26/24 IMPRESSION: 1. Normal parotid glands.  No abnormal mass or adenopathy 2. 15 mm low-attenuation structure in the middle mediastinum is most likely a benign cyst, unchanged from 01/13/2022 chest CT    Assessment/Plan: Encounter Diagnosis  Name Primary?   Sialoadenitis Yes    Assessment and Plan Assessment & Plan Parotid gland fullness vs mass worse on the left x  12 mo, gradually worse Bilateral parotid gland fullness with differential including sialoadenitis vs parotid neoplasm. No facial weakness or numbness, no skin cancer history. No palpable mass on exam - Order CT neck with contrast  - Schedule follow-up to discuss CT results.  Update 04/25/24 Prominent salivary glands, subjective We discussed that her CT did not show parotid masses or lymphadenopathy prominence likely due to dehydration or recent illness causing sialoadenitis. - Encourage adequate hydration. - Advise to return if symptoms recur.  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 - Advised to discuss with primary care physician to have the mediastinal cyst reviewed by a specialist if not previously done.      Thank you for allowing me to participate in the care of this patient. Please do not hesitate to contact me with any questions or concerns.   Elena Larry, MD Otolaryngology [facility] Phone: [phone] Fax: [phone]    04/25/2024, 1:54 PM

## 2024-05-02 ENCOUNTER — Ambulatory Visit: Attending: Anesthesiology | Admitting: Physical Therapy

## 2024-05-02 DIAGNOSIS — M5432 Sciatica, left side: Secondary | ICD-10-CM | POA: Diagnosis present

## 2024-05-02 DIAGNOSIS — M6281 Muscle weakness (generalized): Secondary | ICD-10-CM | POA: Diagnosis present

## 2024-05-02 DIAGNOSIS — M5416 Radiculopathy, lumbar region: Secondary | ICD-10-CM | POA: Diagnosis present

## 2024-05-02 DIAGNOSIS — R2689 Other abnormalities of gait and mobility: Secondary | ICD-10-CM | POA: Insufficient documentation

## 2024-05-02 NOTE — Therapy (Signed)
 OUTPATIENT PHYSICAL THERAPY THORACOLUMBAR EVALUATION   Patient Name: Tammy Hudson MRN: [id number] DOB:1951/01/19, 73 y.o., female Today's Date: 05/02/2024  END OF SESSION:  PT End of Session - 05/02/24 1109     Visit Number 1    Number of Visits 12    Date for Recertification  06/13/24    Authorization Type Medicare    PT Start Time 1105    PT Stop Time 1145    PT Time Calculation (min) 40 min    Activity Tolerance Patient tolerated treatment well          Past Medical History:  Diagnosis Date   Allergy    Anxiety    Arthritis    Cough variant asthma 11/05/2021   Depression    Diabetes mellitus without complication (HCC)    GERD (gastroesophageal reflux disease)    Hyperlipidemia    Hypertension    Neuromuscular disorder (HCC)    Past Surgical History:  Procedure Laterality Date   ABDOMINAL HYSTERECTOMY     COLONOSCOPY WITH PROPOFOL  N/A 01/12/2022   Procedure: COLONOSCOPY WITH PROPOFOL ;  Surgeon: Eartha Angelia Sieving, MD;  Location: [facility];  Service: Gastroenterology;  Laterality: N/A;  220 pre op done already   FLEXIBLE SIGMOIDOSCOPY  01/11/2022   Procedure: FLEXIBLE SIGMOIDOSCOPY;  Surgeon: Eartha Angelia, Sieving, MD;  Location: [facility];  Service: Gastroenterology;;   POLYPECTOMY  01/12/2022   Procedure: POLYPECTOMY INTESTINAL;  Surgeon: Eartha Angelia Sieving, MD;  Location: [facility];  Service: Gastroenterology;;   Patient Active Problem List   Diagnosis Date Noted   Diabetes mellitus treated with oral medication (HCC) 04/18/2024   Hyperlipidemia associated with type 2 diabetes mellitus (HCC) 01/29/2016   Vitamin D  deficiency 01/29/2016   Essential hypertension 04/02/2015   Type 2 diabetes mellitus with peripheral neuropathy (HCC) 04/02/2015   Allergic rhinitis 04/02/2015    PCP: Gladis Mustard, FNP  REFERRING PROVIDER: Laqueta Ozell BIRCH, MD  REFERRING DIAG: M54.16 (ICD-10-CM) - Radiculopathy, lumbar region  Rationale  for Evaluation and Treatment: Rehabilitation  THERAPY DIAG:  Radiculopathy, lumbar region  Sciatica, left side  Muscle weakness (generalized)  Other abnormalities of gait and mobility  ONSET DATE: 2-3 years  SUBJECTIVE:                                                                                                                                                                                           SUBJECTIVE STATEMENT: Pt states she is not sure what happened but she has been having sciatica. Pt reports she had been helping taking care of her mom and had moved up from  . States it starts  from the back of her back hip and down the leg. Will get N/T into feet. It hurts to stand and take a shower. Has had sciatica in the passed. Pain is relatively low today. Had shots from the doctor which helped.   PERTINENT HISTORY:  Diabetes  PAIN:  Are you having pain? Yes: NPRS scale: 0 currently, at worst 8/10 Pain location: L posterior hip Pain description: N/T Aggravating factors: Sitting too long (depending on chair), standing still Relieving factors: walking, laying down  PRECAUTIONS: None  RED FLAGS: None   WEIGHT BEARING RESTRICTIONS: No  FALLS:  Has patient fallen in last 6 months? No  LIVING ENVIRONMENT: Lives with: lives with their spouse Lives in: House/apartment Stairs: No Has following equipment at home: None  OCCUPATION: Retired -- likes to go to church  PLOF: Independent  PATIENT GOALS: Improve pain, sleep longer, improve movement, stand longer  NEXT MD VISIT: after PT  OBJECTIVE:  Note: Objective measures were completed at Evaluation unless otherwise noted.  DIAGNOSTIC FINDINGS:  12/19/23 Lumbar MRI IMPRESSION: 1. L5-S1: Retrolisthesis of 3 mm. 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 Facet degeneration and hypertrophy. Stenosis of the canal and  lateral recesses left more than right. Neural compression is likely at this level, particularly on the left. The facet degenerative changes and degenerative endplate marrow edema could also be associated with regional pain. 2. L4-5: Disc bulge with a small central protrusion. Mild facet ligamentous hypertrophy. Mild multifactorial stenosis at this level that could be symptomatic.    PATIENT SURVEYS:  Modified Oswestry:  MODIFIED OSWESTRY DISABILITY SCALE  Date: 05/02/24 Score  Pain intensity 4 =  Pain medication provides me with little relief from pain.  2. Personal care (washing, dressing, etc.) 1 =  I can take care of myself normally, but it increases my pain.  3. Lifting 1 = I can lift heavy weights, but it causes increased pain.  4. Walking 1 = Pain prevents me from walking more than 1 mile.  5. Sitting 2 =  Pain prevents me from sitting more than 1 hour.  6. Standing 4 =  Pain prevents me from standing more than 10 minutes.  7. Sleeping 3 =  Even when I take pain medication, I sleep less than 4 hours.  8. Social Life 2 = Pain prevents me from participating in more energetic activities (eg. sports, dancing).  9. Traveling 2 =  My pain restricts my travel over 2 hours.  10. Employment/ Homemaking 2 = I can perform most of my homemaking/job duties, but pain prevents me from performing more physically stressful activities (eg, lifting, vacuuming).  Total 22/50   Interpretation of scores: Score Category Description  0-20% Minimal Disability The patient can cope with most living activities. Usually no treatment is indicated apart from advice on lifting, sitting and exercise  21-40% Moderate Disability The patient experiences more pain and difficulty with sitting, lifting and standing. Travel and social life are more difficult and they may be disabled from work. Personal care, sexual activity and sleeping are not grossly affected, and the patient can usually be managed by conservative means   41-60% Severe Disability Pain remains the main problem in this group, but activities of daily living are affected. These patients require a detailed investigation  61-80% Crippled Back pain impinges on all aspects of the patient's life. Positive intervention is required  81-100% Bed-bound These patients are either bed-bound or  exaggerating their symptoms  Bluford FORBES Zoe DELENA Karon DELENA, et al. Surgery versus conservative management of stable thoracolumbar fracture: the PRESTO feasibility RCT. [place] ([place]): [employer]; 2021 Nov. [facility] Technology Assessment, No. 25.62.) Appendix 3, Oswestry Disability Index category descriptors. Available from: FindJewel[employer].cz  Minimally Clinically Important Difference (MCID) = 12.8%  COGNITION: Overall cognitive status: Within functional limits for tasks assessed     SENSATION: N/T in posterior leg  MUSCLE LENGTH: Hamstrings: Right ~30 deg; Left ~70 deg Thomas test: did not assess Quads: Right 45 deg, Left 20 deg  POSTURE: rounded shoulders and increased thoracic kyphosis  PALPATION: TTP L hamstring muscle belly, piriformis, slightly along lumbar paraspinals  LUMBAR ROM:   AROM eval  Flexion 90% pulls in back of leg  Extension 100% feels in back of legs  Right lateral flexion 100%  Left lateral flexion 100%  Right rotation 100%  Left rotation 100% n/t in L LE   (Blank rows = not tested)  LOWER EXTREMITY ROM:     Active  Right eval Left eval  Hip flexion    Hip extension prone 0 0  Hip abduction    Hip adduction    Hip internal rotation    Hip external rotation    Knee flexion    Knee extension    Ankle dorsiflexion    Ankle plantarflexion    Ankle inversion    Ankle eversion     (Blank rows = not tested)  LOWER EXTREMITY MMT:    MMT Right eval Left eval  Hip flexion 4- 3+  Hip extension 2 2  Hip abduction 4 3  Hip adduction    Hip internal rotation    Hip external rotation     Knee flexion 4+ 3+  Knee extension 4+ 4+  Ankle dorsiflexion    Ankle plantarflexion    Ankle inversion    Ankle eversion     (Blank rows = not tested)  LUMBAR SPECIAL TESTS:  Slump test: Positive, FABER: neg; SLR: R 70 deg, L 30 deg,   FUNCTIONAL TESTS:  Did not assess  GAIT: Distance walked: Into clinic Assistive device utilized: None Level of assistance: Complete Independence Comments: Normal reciprocal pattern  TREATMENT DATE: 05/02/24 See HEP below                                                                                                                            PATIENT EDUCATION:  Education details: Exam findings, POC, initial HEP Person educated: Patient Education method: Explanation, Demonstration, and Handouts Education comprehension: verbalized understanding, returned demonstration, and needs further education  HOME EXERCISE PROGRAM: Access Code: [email or web address] URL: [email or web address] Date: 05/02/2024 Prepared by: Zainah Steven April Marie Flint Hakeem  Exercises - Seated Piriformis Stretch  - 1 x daily - 7 x weekly - 2 sets - 30 sec hold - Seated Hamstring Stretch  - 1 x daily - 7 x weekly - 2 sets - 30 hold - Standing Lumbar Extension with  Counter  - 1 x daily - 7 x weekly - 1 sets - 10 reps - Seated Sciatic Tensioner  - 1 x daily - 7 x weekly - 1 sets - 10 reps  ASSESSMENT:  CLINICAL IMPRESSION: Patient is a 73 y.o. F who was seen today for physical therapy evaluation and treatment for L sciatica. Pt has had an episode of sciatica in the past which resolved after PT. Assessment is significant for increased L LE neural tension with taut and short hamstrings and piriformis. Pt demos bilat posterior chain weakness with L weaker than R. Pt will greatly benefit from PT to address these deficits to improve her daily tasks.   OBJECTIVE IMPAIRMENTS: decreased activity tolerance, decreased balance, decreased endurance, decreased mobility, decreased ROM,  decreased strength, increased fascial restrictions, increased muscle spasms, impaired sensation, improper body mechanics, postural dysfunction, and pain.   ACTIVITY LIMITATIONS: sitting, standing, squatting, and transfers  PARTICIPATION LIMITATIONS: meal prep, cleaning, laundry, community activity, and church  PERSONAL FACTORS: Age, Fitness, Past/current experiences, and Time since onset of injury/illness/exacerbation are also affecting patient's functional outcome.   REHAB POTENTIAL: Good  CLINICAL DECISION MAKING: Evolving/moderate complexity  EVALUATION COMPLEXITY: Moderate   GOALS: Goals reviewed with patient? Yes  SHORT TERM GOALS: Target date: 05/23/2024    Pt will be ind with initial HEP Baseline: Goal status: INITIAL  2.  Pt will demo L hamstring length to be at least 75% of R hamstring length for decreased muscle tension Baseline: 30 deg L SLR vs 70 deg R SLR Goal status: INITIAL   LONG TERM GOALS: Target date: 06/13/2024   Pt will be ind with management and progression of HEP Baseline:  Goal status: INITIAL  2.  Pt will report improved modified Oswestry to </=10/50 to demo MCID Baseline:  Goal status: INITIAL  3.  Pt will report at least 80% improvement in overall symptoms Baseline:  Goal status: INITIAL  4.  Pt will be able to tolerate static standing tasks for at least 20-30 min for church/social activities Baseline:  Goal status: INITIAL   PLAN:  PT FREQUENCY: 1-2x/week  PT DURATION: 6 weeks  PLANNED INTERVENTIONS: 97164- PT Re-evaluation, 97750- Physical Performance Testing, 97110-Therapeutic exercises, 97530- Therapeutic activity, [id number]- Neuromuscular re-education, 97535- Self Care, [id number]- Manual therapy, [id number]- Gait training, [phone]- Canalith repositioning, H9716- Electrical stimulation (unattended), 97016- Vasopneumatic device, [id number]- Ionotophoresis 4mg /ml Dexamethasone , Patient/Family education, Balance training, Stair training, Taping, Joint  mobilization, Spinal mobilization, Cryotherapy, and Moist heat.  PLAN FOR NEXT SESSION: Assess response to HEP. Continue stretching/manual work quads/hamstrings/piriformis/glutes. Initiate hip strengthening   Zaeda Mcferran April Ma L Matthe Sloane, PT, DPT 05/02/2024, 12:11 PM

## 2024-05-07 ENCOUNTER — Ambulatory Visit: Admitting: *Deleted

## 2024-05-07 ENCOUNTER — Encounter: Payer: Self-pay | Admitting: *Deleted

## 2024-05-07 DIAGNOSIS — M5432 Sciatica, left side: Secondary | ICD-10-CM

## 2024-05-07 DIAGNOSIS — M5416 Radiculopathy, lumbar region: Secondary | ICD-10-CM

## 2024-05-07 DIAGNOSIS — M6281 Muscle weakness (generalized): Secondary | ICD-10-CM

## 2024-05-07 NOTE — Therapy (Signed)
 OUTPATIENT PHYSICAL THERAPY THORACOLUMBAR TREATMENT   Patient Name: Tammy Hudson MRN: [id number] DOB:[birth year], 73 y.o., female Today's Date: 05/07/2024  END OF SESSION:  PT End of Session - 05/07/24 1554     Visit Number 2    Number of Visits 12    Date for Recertification  06/13/24    Authorization Type Medicare    PT Start Time 1555          Past Medical History:  Diagnosis Date   Allergy    Anxiety    Arthritis    Cough variant asthma 11/05/2021   Depression    Diabetes mellitus without complication (HCC)    GERD (gastroesophageal reflux disease)    Hyperlipidemia    Hypertension    Neuromuscular disorder (HCC)    Past Surgical History:  Procedure Laterality Date   ABDOMINAL HYSTERECTOMY     COLONOSCOPY WITH PROPOFOL  N/A 01/12/2022   Procedure: COLONOSCOPY WITH PROPOFOL ;  Surgeon: Eartha Angelia Sieving, MD;  Location: [facility];  Service: Gastroenterology;  Laterality: N/A;  220 pre op done already   FLEXIBLE SIGMOIDOSCOPY  01/11/2022   Procedure: FLEXIBLE SIGMOIDOSCOPY;  Surgeon: Eartha Angelia, Sieving, MD;  Location: [facility];  Service: Gastroenterology;;   POLYPECTOMY  01/12/2022   Procedure: POLYPECTOMY INTESTINAL;  Surgeon: Eartha Angelia Sieving, MD;  Location: [facility];  Service: Gastroenterology;;   Patient Active Problem List   Diagnosis Date Noted   Diabetes mellitus treated with oral medication (HCC) 04/18/2024   Hyperlipidemia associated with type 2 diabetes mellitus (HCC) 01/29/2016   Vitamin D  deficiency 01/29/2016   Essential hypertension 04/02/2015   Type 2 diabetes mellitus with peripheral neuropathy (HCC) 04/02/2015   Allergic rhinitis 04/02/2015    PCP: Gladis Mustard, FNP  REFERRING PROVIDER: Laqueta Ozell BIRCH, MD  REFERRING DIAG: M54.16 (ICD-10-CM) - Radiculopathy, lumbar region  Rationale for Evaluation and Treatment: Rehabilitation  THERAPY DIAG:  Radiculopathy, lumbar region  Sciatica, left  side  Muscle weakness (generalized)  ONSET DATE: 2-3 years  SUBJECTIVE:                                                                                                                                                                                           SUBJECTIVE STATEMENT: Pt states LT side LB and LE pain  7/10 PERTINENT HISTORY:  Diabetes  PAIN:  Are you having pain? Yes: NPRS scale: 0 currently, at worst 8/10 Pain location: L posterior hip Pain description: N/T Aggravating factors: Sitting too long (depending on chair), standing still Relieving factors: walking, laying down  PRECAUTIONS: None  RED FLAGS: None   WEIGHT BEARING RESTRICTIONS: No  FALLS:  Has patient fallen in last 6 months? No  LIVING ENVIRONMENT: Lives with: lives with their spouse Lives in: House/apartment Stairs: No Has following equipment at home: None  OCCUPATION: Retired -- likes to go to church  PLOF: Independent  PATIENT GOALS: Improve pain, sleep longer, improve movement, stand longer  NEXT MD VISIT: after PT  OBJECTIVE:  Note: Objective measures were completed at Evaluation unless otherwise noted.  DIAGNOSTIC FINDINGS:  12/19/23 Lumbar MRI IMPRESSION: 1. L5-S1: Retrolisthesis of 3 mm. 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 Facet degeneration and hypertrophy. Stenosis of the canal and lateral recesses left more than right. Neural compression is likely at this level, particularly on the left. The facet degenerative changes and degenerative endplate marrow edema could also be associated with regional pain. 2. L4-5: Disc bulge with a small central protrusion. Mild facet ligamentous hypertrophy. Mild multifactorial stenosis at this level that could be symptomatic.    PATIENT SURVEYS:  Modified Oswestry:  MODIFIED OSWESTRY DISABILITY SCALE  Date: 05/02/24 Score  Pain intensity 4 =   Pain medication provides me with little relief from pain.  2. Personal care (washing, dressing, etc.) 1 =  I can take care of myself normally, but it increases my pain.  3. Lifting 1 = I can lift heavy weights, but it causes increased pain.  4. Walking 1 = Pain prevents me from walking more than 1 mile.  5. Sitting 2 =  Pain prevents me from sitting more than 1 hour.  6. Standing 4 =  Pain prevents me from standing more than 10 minutes.  7. Sleeping 3 =  Even when I take pain medication, I sleep less than 4 hours.  8. Social Life 2 = Pain prevents me from participating in more energetic activities (eg. sports, dancing).  9. Traveling 2 =  My pain restricts my travel over 2 hours.  10. Employment/ Homemaking 2 = I can perform most of my homemaking/job duties, but pain prevents me from performing more physically stressful activities (eg, lifting, vacuuming).  Total 22/50   Interpretation of scores: Score Category Description  0-20% Minimal Disability The patient can cope with most living activities. Usually no treatment is indicated apart from advice on lifting, sitting and exercise  21-40% Moderate Disability The patient experiences more pain and difficulty with sitting, lifting and standing. Travel and social life are more difficult and they may be disabled from work. Personal care, sexual activity and sleeping are not grossly affected, and the patient can usually be managed by conservative means  41-60% Severe Disability Pain remains the main problem in this group, but activities of daily living are affected. These patients require a detailed investigation  61-80% Crippled Back pain impinges on all aspects of the patient's life. Positive intervention is required  81-100% Bed-bound These patients are either bed-bound or exaggerating their symptoms  Bluford FORBES Zoe DELENA Karon DELENA, et al. Surgery versus conservative management of stable thoracolumbar fracture: the PRESTO feasibility RCT. [place]  ([place]): [employer]; 2021 Nov. [facility] Technology Assessment, No. 25.62.) Appendix 3, Oswestry Disability Index category descriptors. Available from: FindJewelers.cz  Minimally Clinically Important Difference (MCID) = 12.8%  COGNITION: Overall cognitive status: Within functional limits for tasks assessed     SENSATION: N/T in posterior leg  MUSCLE LENGTH: Hamstrings: Right ~30 deg; Left ~70 deg Thomas test: did not assess Quads: Right 45 deg, Left 20 deg  POSTURE: rounded shoulders and increased  thoracic kyphosis  PALPATION: TTP L hamstring muscle belly, piriformis, slightly along lumbar paraspinals  LUMBAR ROM:   AROM eval  Flexion 90% pulls in back of leg  Extension 100% feels in back of legs  Right lateral flexion 100%  Left lateral flexion 100%  Right rotation 100%  Left rotation 100% n/t in L LE   (Blank rows = not tested)  LOWER EXTREMITY ROM:     Active  Right eval Left eval  Hip flexion    Hip extension prone 0 0  Hip abduction    Hip adduction    Hip internal rotation    Hip external rotation    Knee flexion    Knee extension    Ankle dorsiflexion    Ankle plantarflexion    Ankle inversion    Ankle eversion     (Blank rows = not tested)  LOWER EXTREMITY MMT:    MMT Right eval Left eval  Hip flexion 4- 3+  Hip extension 2 2  Hip abduction 4 3  Hip adduction    Hip internal rotation    Hip external rotation    Knee flexion 4+ 3+  Knee extension 4+ 4+  Ankle dorsiflexion    Ankle plantarflexion    Ankle inversion    Ankle eversion     (Blank rows = not tested)  LUMBAR SPECIAL TESTS:  Slump test: Positive, FABER: neg; SLR: R 70 deg, L 30 deg,   FUNCTIONAL TESTS:  Did not assess  GAIT: Distance walked: Into clinic Assistive device utilized: None Level of assistance: Complete Independence Comments: Normal reciprocal pattern  TREATMENT DATE: 05/07/24 Standing counter decompression x 5 LT LE  numbness Positional traction over bolster RT SL x 7 mins  LT LE numbness     STW/TPR to LT side LB paras and QL with Pt seated and leaning forward on plinth IFC to LB paras x 15 mins 80-150hz  seated                                                                                                                   PATIENT EDUCATION:  Education details: Exam findings, POC, initial HEP Person educated: Patient Education method: Explanation, Demonstration, and Handouts Education comprehension: verbalized understanding, returned demonstration, and needs further education  HOME EXERCISE PROGRAM: Access Code: [email or web address] URL: [email or web address] Date: 05/02/2024 Prepared by: Gellen April Earnie Starring  Exercises - Seated Piriformis Stretch  - 1 x daily - 7 x weekly - 2 sets - 30 sec hold - Seated Hamstring Stretch  - 1 x daily - 7 x weekly - 2 sets - 30 hold - Standing Lumbar Extension with Counter  - 1 x daily - 7 x weekly - 1 sets - 10 reps - Seated Sciatic Tensioner  - 1 x daily - 7 x weekly - 1 sets - 10 reps  ASSESSMENT:  CLINICAL IMPRESSION: Patient arrived today doing fair, but having a hard time standing or walking to long. Rx focused on decompression positions for LT side sciatica,  but unable to decrease LT LE symptoms except with seated position leaning forward resting in plinth. STW performed to LT side LB paras as well as IFC to LB . Pt to be put on hold x 2 weeks   OBJECTIVE IMPAIRMENTS: decreased activity tolerance, decreased balance, decreased endurance, decreased mobility, decreased ROM, decreased strength, increased fascial restrictions, increased muscle spasms, impaired sensation, improper body mechanics, postural dysfunction, and pain.   ACTIVITY LIMITATIONS: sitting, standing, squatting, and transfers  PARTICIPATION LIMITATIONS: meal prep, cleaning, laundry, community activity, and church  PERSONAL FACTORS: Age, Fitness, Past/current experiences, and Time  since onset of injury/illness/exacerbation are also affecting patient's functional outcome.   REHAB POTENTIAL: Good  CLINICAL DECISION MAKING: Evolving/moderate complexity  EVALUATION COMPLEXITY: Moderate   GOALS: Goals reviewed with patient? Yes  SHORT TERM GOALS: Target date: 05/23/2024    Pt will be ind with initial HEP Baseline: Goal status: INITIAL  2.  Pt will demo L hamstring length to be at least 75% of R hamstring length for decreased muscle tension Baseline: 30 deg L SLR vs 70 deg R SLR Goal status: INITIAL   LONG TERM GOALS: Target date: 06/13/2024   Pt will be ind with management and progression of HEP Baseline:  Goal status: INITIAL  2.  Pt will report improved modified Oswestry to </=10/50 to demo MCID Baseline:  Goal status: INITIAL  3.  Pt will report at least 80% improvement in overall symptoms Baseline:  Goal status: INITIAL  4.  Pt will be able to tolerate static standing tasks for at least 20-30 min for church/social activities Baseline:  Goal status: INITIAL   PLAN:  PT FREQUENCY: 1-2x/week  PT DURATION: 6 weeks  PLANNED INTERVENTIONS: 97164- PT Re-evaluation, 97750- Physical Performance Testing, 97110-Therapeutic exercises, 97530- Therapeutic activity, [id number]- Neuromuscular re-education, 97535- Self Care, 02859- Manual therapy, [id number]- Gait training, [phone]- Canalith repositioning, [id number]- Electrical stimulation (unattended), 97016- Vasopneumatic device, [id number]- Ionotophoresis 4mg /ml Dexamethasone , Patient/Family education, Balance training, Stair training, Taping, Joint mobilization, Spinal mobilization, Cryotherapy, and Moist heat.  PLAN FOR NEXT SESSION: Assess response to HEP. Continue stretching/manual work quads/hamstrings/piriformis/glutes. Initiate hip strengthening   Alton Bouknight,CHRIS, PTA, DPT 05/07/2024, 3:55 PM

## 2024-05-08 ENCOUNTER — Other Ambulatory Visit: Payer: Self-pay | Admitting: Nurse Practitioner

## 2024-05-08 DIAGNOSIS — M542 Cervicalgia: Secondary | ICD-10-CM

## 2024-05-10 ENCOUNTER — Other Ambulatory Visit: Payer: Self-pay | Admitting: Family

## 2024-05-10 ENCOUNTER — Encounter: Admitting: *Deleted

## 2024-05-10 DIAGNOSIS — M5432 Sciatica, left side: Secondary | ICD-10-CM

## 2024-07-04 ENCOUNTER — Ambulatory Visit: Payer: Self-pay

## 2024-07-04 VITALS — BP 122/72 | HR 90 | Temp 98.2°F | Ht 67.0 in | Wt 215.0 lb

## 2024-07-04 DIAGNOSIS — Z0000 Encounter for general adult medical examination without abnormal findings: Secondary | ICD-10-CM

## 2024-07-04 NOTE — Patient Instructions (Signed)
 Tammy Hudson,  Thank you for taking the time for your Medicare Wellness Visit. I appreciate your continued commitment to your health goals. Please review the care plan we discussed, and feel free to reach out if I can assist you further.  Please note that Annual Wellness Visits do not include a physical exam. Some assessments may be limited, especially if the visit was conducted virtually. If needed, we may recommend an in-person follow-up with your provider.  Ongoing Care Seeing your primary care provider every 3 to 6 months helps us  monitor your health and provide consistent, personalized care.   Referrals If a referral was made during today's visit and you haven't received any updates within two weeks, please contact the referred provider directly to check on the status.  Recommended Screenings:  Health Maintenance  Topic Date Due   Hepatitis C Screening  Never done   Eye exam for diabetics  06/21/2023   COVID-19 Vaccine (4 - 2025-26 season) 03/25/2024   Medicare Annual Wellness Visit  07/02/2024   Yearly kidney health urinalysis for diabetes  10/16/2024   Hemoglobin A1C  10/16/2024   Yearly kidney function blood test for diabetes  04/18/2025   Complete foot exam   04/18/2025   Breast Cancer Screening  09/17/2025   Osteoporosis screening with Bone Density Scan  11/22/2025   DTaP/Tdap/Td vaccine (2 - Td or Tdap) 07/25/2028   Colon Cancer Screening  01/12/2029   Pneumococcal Vaccine for age over 37  Completed   Flu Shot  Completed   Zoster (Shingles) Vaccine  Completed   Meningitis B Vaccine  Aged Out       07/03/2023   11:50 AM  Advanced Directives  Does Patient Have a Medical Advance Directive? No  Would patient like information on creating a medical advance directive? Yes (MAU/Ambulatory/Procedural Areas - Information given)    Vision: Annual vision screenings are recommended for early detection of glaucoma, cataracts, and diabetic retinopathy. These exams can also reveal  signs of chronic conditions such as diabetes and high blood pressure.  Dental: Annual dental screenings help detect early signs of oral cancer, gum disease, and other conditions linked to overall health, including heart disease and diabetes.  Please see the attached documents for additional preventive care recommendations.

## 2024-07-04 NOTE — Progress Notes (Signed)
 Chief Complaint  Patient presents with   Medicare Wellness     Subjective:   Tammy Hudson is a 73 y.o. female who presents for a Medicare Annual Wellness Visit.  Visit info / Clinical Intake: Interpreter Needed?: No  Fall Screening Falls in the past year?: 0    Allergies (verified) Patient has no known allergies.   Current Medications (verified) Outpatient Encounter Medications as of 07/04/2024  Medication Sig   albuterol  (PROVENTIL ) (2.5 MG/3ML) 0.083% nebulizer solution Inhale 3 mLs into the lungs every 4 (four) hours as needed for shortness of breath.   ALPRAZolam  (XANAX ) 0.25 MG tablet Take 1 tablet (0.25 mg total) by mouth 2 (two) times daily as needed.   amLODipine  (NORVASC ) 5 MG tablet Take 1 tablet (5 mg total) by mouth daily.   atorvastatin  (LIPITOR) 40 MG tablet Take 1 tablet (40 mg total) by mouth daily.   cyclobenzaprine  (FLEXERIL ) 5 MG tablet TAKE 1 TABLET BY MOUTH THREE TIMES A DAY AS NEEDED FOR MUSCLE SPASM   diclofenac  (VOLTAREN ) 75 MG EC tablet TAKE 1 TABLET BY MOUTH TWICE A DAY   diclofenac  Sodium (VOLTAREN ) 1 % GEL Apply 4 g topically 4 (four) times daily.   escitalopram  (LEXAPRO ) 20 MG tablet Take 1 tablet (20 mg total) by mouth daily as needed (depression).   fluticasone-salmeterol (ADVAIR  HFA) 115-21 MCG/ACT inhaler Inhale 2 puffs into the lungs 2 (two) times daily.   folic acid (FOLVITE) 800 MCG tablet Take 800 mcg by mouth daily.   losartan  (COZAAR ) 100 MG tablet Take 1 tablet (100 mg total) by mouth daily.   pantoprazole  (PROTONIX ) 40 MG tablet Take 1 tablet (40 mg total) by mouth daily.   simvastatin  (ZOCOR ) 20 MG tablet Take 1 tablet (20 mg total) by mouth daily at 6 PM.   sitaGLIPtin  (JANUVIA ) 100 MG tablet Take 1 tablet (100 mg total) by mouth daily.   zolpidem  (AMBIEN ) 10 MG tablet Take 1 tablet (10 mg total) by mouth at bedtime as needed for sleep.   No facility-administered encounter medications on file as of 07/04/2024.    History: Past  Medical History:  Diagnosis Date   Allergy    Anxiety    Arthritis    Cough variant asthma 11/05/2021   Depression    Diabetes mellitus without complication (HCC)    GERD (gastroesophageal reflux disease)    Hyperlipidemia    Hypertension    Neuromuscular disorder (HCC)    Past Surgical History:  Procedure Laterality Date   ABDOMINAL HYSTERECTOMY     COLONOSCOPY WITH PROPOFOL  N/A 01/12/2022   Procedure: COLONOSCOPY WITH PROPOFOL ;  Surgeon: Eartha Angelia Sieving, MD;  Location: [facility];  Service: Gastroenterology;  Laterality: N/A;  220 pre op done already   FLEXIBLE SIGMOIDOSCOPY  01/11/2022   Procedure: FLEXIBLE SIGMOIDOSCOPY;  Surgeon: Eartha Angelia Sieving, MD;  Location: [facility];  Service: Gastroenterology;;   POLYPECTOMY  01/12/2022   Procedure: POLYPECTOMY INTESTINAL;  Surgeon: Eartha Angelia Sieving, MD;  Location: [facility];  Service: Gastroenterology;;   History reviewed. No pertinent family history. Social History   Occupational History   Not on file  Tobacco Use   Smoking status: Former    Current packs/day: 0.00    Types: Cigarettes    Quit date: 04/02/1995    Years since quitting: 29.2   Smokeless tobacco: Never  Substance and Sexual Activity   Alcohol use: No    Alcohol/week: 0.0 standard drinks of alcohol   Drug use: No  Sexual activity: Not on file   Tobacco Counseling Counseling given: Not Answered  SDOH Screenings   Food Insecurity: Patient Declined (07/04/2024)  Housing: Unknown (07/04/2024)  Transportation Needs: No Transportation Needs (07/04/2024)  Utilities: Patient Declined (07/04/2024)  Alcohol Screen: Low Risk (07/03/2023)  Depression (PHQ2-9): Low Risk (07/04/2024)  Recent Concern: Depression (PHQ2-9) - High Risk (04/18/2024)  Financial Resource Strain: Low Risk (07/03/2023)  Physical Activity: Insufficiently Active (07/04/2024)  Social Connections: Socially Integrated (07/04/2024)  Stress: No Stress Concern  Present (07/04/2024)  Tobacco Use: Medium Risk (07/04/2024)  Health Literacy: Adequate Health Literacy (07/04/2024)   See flowsheets for full screening details  Depression Screen PHQ 2 & 9 Depression Scale- Over the past 2 weeks, how often have you been bothered by any of the following problems? Little interest or pleasure in doing things: 0 Feeling down, depressed, or hopeless (PHQ Adolescent also includes...irritable): 0 PHQ-2 Total Score: 0 Trouble falling or staying asleep, or sleeping too much: 3 Feeling tired or having little energy: 3 Poor appetite or overeating (PHQ Adolescent also includes...weight loss): 3 Feeling bad about yourself - or that you are a failure or have let yourself or your family down: 2 Trouble concentrating on things, such as reading the newspaper or watching television (PHQ Adolescent also includes...like school work): 3 Moving or speaking so slowly that other people could have noticed. Or the opposite - being so fidgety or restless that you have been moving around a lot more than usual: 0 Thoughts that you would be better off dead, or of hurting yourself in some way: 0 PHQ-9 Total Score: 20 If you checked off any problems, how difficult have these problems made it for you to do your work, take care of things at home, or get along with other people?: Somewhat difficult  Depression Treatment Depression Interventions/Treatment : Currently on Treatment     Goals Addressed             This Visit's Progress    Remain active and independent   On track            Objective:    There were no vitals filed for this visit. There is no height or weight on file to calculate BMI.  Hearing/Vision screen No results found. Immunizations and Health Maintenance Health Maintenance  Topic Date Due   Hepatitis C Screening  Never done   OPHTHALMOLOGY EXAM  06/21/2023   COVID-19 Vaccine (4 - 2025-26 season) 03/25/2024   Diabetic kidney evaluation - Urine ACR   10/16/2024   HEMOGLOBIN A1C  10/16/2024   Diabetic kidney evaluation - eGFR measurement  04/18/2025   FOOT EXAM  04/18/2025   Medicare Annual Wellness (AWV)  07/04/2025   Mammogram  09/17/2025   Bone Density Scan  11/22/2025   DTaP/Tdap/Td (2 - Td or Tdap) 07/25/2028   Colonoscopy  01/12/2029   Pneumococcal Vaccine: 50+ Years  Completed   Influenza Vaccine  Completed   Zoster Vaccines- Shingrix  Completed   Meningococcal B Vaccine  Aged Out        Assessment/Plan:  This is a routine wellness examination for Amayiah.  Patient Care Team: Gladis Mustard, FNP as PCP - General (Family Medicine) Curt Lighter, MD as Consulting Physician (Rheumatology) Darlean Ozell NOVAK, MD as Consulting Physician (Pulmonary Disease) Vicci Mcardle, OD (Optometry)  I have personally reviewed and noted the following in the patients chart:   Medical and social history Use of alcohol, tobacco or illicit drugs  Current medications and supplements including opioid  prescriptions. Functional ability and status Nutritional status Physical activity Advanced directives List of other physicians Hospitalizations, surgeries, and ER visits in previous 12 months Vitals Screenings to include cognitive, depression, and falls Referrals and appointments  No orders of the defined types were placed in this encounter.  In addition, I have reviewed and discussed with patient certain preventive protocols, quality metrics, and best practice recommendations. A written personalized care plan for preventive services as well as general preventive health recommendations were provided to patient.   Ozie Ned, CMA   07/04/2024   Return in 1 year (on 07/04/2025).  After Visit Summary: (MyChart) Due to this being a telephonic visit, the after visit summary with patients personalized plan was offered to patient via MyChart   Nurse Notes: Pt is aware and due the following: Diabetic Eye exam--apt 08/01/24 covid--pt  declined ; hep c--alreay been done 07/04/22 per pt  I have reviewed and agree with the above AWV documentation.   Mary-Margaret Gladis, FNP

## 2024-07-13 ENCOUNTER — Other Ambulatory Visit: Payer: Self-pay | Admitting: Nurse Practitioner

## 2024-07-13 DIAGNOSIS — M542 Cervicalgia: Secondary | ICD-10-CM

## 2024-07-23 ENCOUNTER — Telehealth: Payer: Self-pay | Admitting: Nurse Practitioner

## 2024-07-23 MED ORDER — OMEPRAZOLE 40 MG PO CPDR: 40.0000 mg | DELAYED_RELEASE_CAPSULE | Freq: Every day | ORAL | 3 refills | Status: AC

## 2024-07-23 NOTE — Telephone Encounter (Signed)
 Copied from CRM #[id number]. Topic: Clinical - Medication Question >> Jul 23, 2024 11:28 AM Tinnie C wrote: Reason for CRM: Pt has been having bad acid reflux lately and what she is taking for it is not helping at all. She says she used to take omeprazole 20mg  a few years ago that helped her. She is wondering if this can be sent in to: [id number] - [place], [place] - [address] [place] [address] Phone: [phone] Fax: [phone]

## 2024-08-05 ENCOUNTER — Other Ambulatory Visit: Payer: Self-pay | Admitting: Nurse Practitioner

## 2024-08-05 ENCOUNTER — Other Ambulatory Visit (HOSPITAL_COMMUNITY): Payer: Self-pay | Admitting: Nurse Practitioner

## 2024-08-05 DIAGNOSIS — M542 Cervicalgia: Secondary | ICD-10-CM

## 2024-08-05 DIAGNOSIS — Z1231 Encounter for screening mammogram for malignant neoplasm of breast: Secondary | ICD-10-CM

## 2024-08-13 ENCOUNTER — Encounter: Payer: Self-pay | Admitting: Nurse Practitioner

## 2024-08-13 ENCOUNTER — Ambulatory Visit (INDEPENDENT_AMBULATORY_CARE_PROVIDER_SITE_OTHER): Payer: Medicare (Managed Care) | Admitting: Nurse Practitioner

## 2024-08-13 VITALS — BP 128/78 | HR 90 | Temp 97.7°F | Ht 67.0 in | Wt 213.0 lb

## 2024-08-13 DIAGNOSIS — N644 Mastodynia: Secondary | ICD-10-CM | POA: Diagnosis not present

## 2024-08-13 NOTE — Progress Notes (Signed)
" ° °  Subjective:    Patient ID: Tammy Hudson, female    DOB: 02/27/1951, 74 y.o.   MRN: [id number]   Chief Complaint: Red area under right breast   HPI  Patient in c/o red area under right breast. Noticed it 2 days ago. Unchanged in size.  Patient Active Problem List   Diagnosis Date Noted   Diabetes mellitus treated with oral medication (HCC) 04/18/2024   Hyperlipidemia associated with type 2 diabetes mellitus (HCC) 01/29/2016   Vitamin D  deficiency 01/29/2016   Essential hypertension 04/02/2015   Type 2 diabetes mellitus with peripheral neuropathy (HCC) 04/02/2015   Allergic rhinitis 04/02/2015       Review of Systems  Constitutional:  Negative for diaphoresis.  Eyes:  Negative for pain.  Respiratory:  Negative for shortness of breath.   Cardiovascular:  Negative for chest pain, palpitations and leg swelling.  Gastrointestinal:  Negative for abdominal pain.  Endocrine: Negative for polydipsia.  Skin:  Negative for rash.  Neurological:  Negative for dizziness, weakness and headaches.  Hematological:  Does not bruise/bleed easily.  All other systems reviewed and are negative.      Objective:   Physical Exam Constitutional:      Appearance: Normal appearance. She is obese.  Cardiovascular:     Rate and Rhythm: Normal rate and regular rhythm.     Heart sounds: Normal heart sounds.  Pulmonary:     Effort: Pulmonary effort is normal.     Breath sounds: Normal breath sounds.  Skin:    General: Skin is warm.     Comments: 2cm annular nontender area on right breast. Top has come off. No drainage  Neurological:     General: No focal deficit present.     Mental Status: She is alert and oriented to person, place, and time.  Psychiatric:        Mood and Affect: Mood normal.        Behavior: Behavior normal.     BP 128/78   Pulse 90   Temp 97.7 F (36.5 C) (Temporal)   Ht 5' 7 (1.702 m)   Wt 213 lb (96.6 kg)   SpO2 97%   BMI 33.36 kg/m        Assessment &  Plan:   Tammy Hudson in today with chief complaint of Red area under right breast   1. Soreness breast (Primary) Clean with antibacterial soap BID Triple antibiotic ointment OTC bid Do not pick at area RTO prn    The above assessment and management plan was discussed with the patient. The patient verbalized understanding of and has agreed to the management plan. Patient is aware to call the clinic if symptoms persist or worsen. Patient is aware when to return to the clinic for a follow-up visit. Patient educated on when it is appropriate to go to the emergency department.   Mary-Margaret Gladis, FNP   "

## 2024-08-19 ENCOUNTER — Other Ambulatory Visit: Payer: Self-pay | Admitting: Nurse Practitioner

## 2024-08-19 DIAGNOSIS — M542 Cervicalgia: Secondary | ICD-10-CM

## 2024-08-20 ENCOUNTER — Telehealth: Payer: Self-pay

## 2024-08-20 NOTE — Telephone Encounter (Signed)
 Copied from CRM #[id number]. Topic: General - Other >> Aug 20, 2024 12:07 PM Miquel SAILOR wrote: Reason for CRM: PT calling for a personal questions only to be discussed with office only. Called and transferred

## 2024-08-20 NOTE — Telephone Encounter (Signed)
 Patient spoke with front staff. Asking if anyone had found any keys. She thinks she left some here on Friday. No keys were found and we advised that we would contact her if we found them

## 2024-08-27 ENCOUNTER — Telehealth: Payer: Self-pay

## 2024-08-27 NOTE — Telephone Encounter (Signed)
 Called and spoke with patient. She states that her right breast had the same thing and she used hydrocortisone cream as directed and places went away. Now has two blisters under left breast. Is currently using hydrocortisone on that side as well. Will give it a few days and if it gets worse she will come in to be seen.

## 2024-08-27 NOTE — Telephone Encounter (Signed)
 Copied from CRM [id number]. Topic: Clinical - Medical Advice >> Aug 26, 2024  3:19 PM Antwanette L wrote: Reason for CRM: The pt reports two blisters under her left breast. The right breast has cleared. The pt does not have any symptoms. The pt is requesting a callback from Mary-Margaret. The pt can be reached at [phone]

## 2024-08-27 NOTE — Telephone Encounter (Signed)
 Please call patient and find out exactly what is going on with her. Does it hurt, burn, itch. Is it raw around it?

## 2024-09-18 ENCOUNTER — Ambulatory Visit (HOSPITAL_COMMUNITY)

## 2024-10-15 ENCOUNTER — Ambulatory Visit: Payer: Self-pay | Admitting: Nurse Practitioner

## 2025-07-07 ENCOUNTER — Ambulatory Visit
